# Patient Record
Sex: Female | Born: 1945 | Race: White | Hispanic: No | Marital: Married | Smoking: Former smoker
Health system: Southern US, Community
[De-identification: ages and names within clinical notes are randomized; demographics above are authoritative.]

## PROBLEM LIST (undated history)

## (undated) DIAGNOSIS — E079 Disorder of thyroid, unspecified: Secondary | ICD-10-CM

## (undated) DIAGNOSIS — J189 Pneumonia, unspecified organism: Secondary | ICD-10-CM

## (undated) DIAGNOSIS — I10 Essential (primary) hypertension: Secondary | ICD-10-CM

## (undated) DIAGNOSIS — E785 Hyperlipidemia, unspecified: Secondary | ICD-10-CM

## (undated) HISTORY — DX: Disorder of thyroid, unspecified: E07.9

## (undated) HISTORY — PX: THYROIDECTOMY: SHX17

## (undated) HISTORY — DX: Hyperlipidemia, unspecified: E78.5

## (undated) HISTORY — PX: TUBAL LIGATION: SHX77

## (undated) HISTORY — PX: TONSILLECTOMY AND ADENOIDECTOMY: SUR1326

## (undated) HISTORY — PX: LAPAROSCOPIC ABDOMINAL EXPLORATION: SHX6249

## (undated) HISTORY — DX: Essential (primary) hypertension: I10

---

## 2008-10-30 ENCOUNTER — Encounter: Admission: RE | Admit: 2008-10-30 | Discharge: 2008-10-30 | Payer: Self-pay | Admitting: Family Medicine

## 2008-11-04 ENCOUNTER — Encounter: Admission: RE | Admit: 2008-11-04 | Discharge: 2008-11-04 | Payer: Self-pay | Admitting: Family Medicine

## 2009-03-20 ENCOUNTER — Other Ambulatory Visit: Admission: RE | Admit: 2009-03-20 | Discharge: 2009-03-20 | Payer: Self-pay | Admitting: Family Medicine

## 2009-11-21 ENCOUNTER — Encounter: Admission: RE | Admit: 2009-11-21 | Discharge: 2009-11-21 | Payer: Self-pay | Admitting: Family Medicine

## 2010-12-08 ENCOUNTER — Other Ambulatory Visit: Payer: Self-pay | Admitting: Family Medicine

## 2010-12-08 DIAGNOSIS — Z1231 Encounter for screening mammogram for malignant neoplasm of breast: Secondary | ICD-10-CM

## 2010-12-28 ENCOUNTER — Ambulatory Visit
Admission: RE | Admit: 2010-12-28 | Discharge: 2010-12-28 | Disposition: A | Discharge status: Home / Self Care | Payer: Medicare Other | Source: Ambulatory Visit | Attending: Family Medicine | Admitting: Family Medicine

## 2010-12-28 DIAGNOSIS — Z1231 Encounter for screening mammogram for malignant neoplasm of breast: Secondary | ICD-10-CM

## 2011-04-13 ENCOUNTER — Other Ambulatory Visit (HOSPITAL_COMMUNITY)
Admission: RE | Admit: 2011-04-13 | Discharge: 2011-04-13 | Disposition: A | Discharge status: Home / Self Care | Payer: Medicare Other | Source: Ambulatory Visit | Attending: Family Medicine | Admitting: Family Medicine

## 2011-04-13 ENCOUNTER — Other Ambulatory Visit: Payer: Self-pay | Admitting: Family Medicine

## 2011-04-13 DIAGNOSIS — Z124 Encounter for screening for malignant neoplasm of cervix: Secondary | ICD-10-CM | POA: Insufficient documentation

## 2011-11-29 ENCOUNTER — Other Ambulatory Visit: Payer: Self-pay | Admitting: Family Medicine

## 2011-11-29 DIAGNOSIS — Z1231 Encounter for screening mammogram for malignant neoplasm of breast: Secondary | ICD-10-CM

## 2011-12-29 ENCOUNTER — Ambulatory Visit
Admission: RE | Admit: 2011-12-29 | Discharge: 2011-12-29 | Disposition: A | Discharge status: Home / Self Care | Payer: Medicare Other | Source: Ambulatory Visit | Attending: Family Medicine | Admitting: Family Medicine

## 2011-12-29 DIAGNOSIS — Z1231 Encounter for screening mammogram for malignant neoplasm of breast: Secondary | ICD-10-CM

## 2013-02-26 ENCOUNTER — Other Ambulatory Visit: Payer: Self-pay

## 2013-02-26 DIAGNOSIS — Z1231 Encounter for screening mammogram for malignant neoplasm of breast: Secondary | ICD-10-CM

## 2013-03-21 ENCOUNTER — Ambulatory Visit
Admission: RE | Admit: 2013-03-21 | Discharge: 2013-03-21 | Disposition: A | Discharge status: Home / Self Care | Payer: Medicare Other | Source: Ambulatory Visit

## 2013-03-21 DIAGNOSIS — Z1231 Encounter for screening mammogram for malignant neoplasm of breast: Secondary | ICD-10-CM

## 2013-04-27 ENCOUNTER — Other Ambulatory Visit: Payer: Self-pay | Admitting: Family Medicine

## 2013-04-27 ENCOUNTER — Other Ambulatory Visit (HOSPITAL_COMMUNITY)
Admission: RE | Admit: 2013-04-27 | Discharge: 2013-04-27 | Disposition: A | Discharge status: Home / Self Care | Payer: Medicare Other | Source: Ambulatory Visit | Attending: Family Medicine | Admitting: Family Medicine

## 2013-04-27 ENCOUNTER — Ambulatory Visit
Admission: RE | Admit: 2013-04-27 | Discharge: 2013-04-27 | Disposition: A | Discharge status: Home / Self Care | Payer: Medicare Other | Source: Ambulatory Visit | Attending: Family Medicine | Admitting: Family Medicine

## 2013-04-27 DIAGNOSIS — S93409A Sprain of unspecified ligament of unspecified ankle, initial encounter: Secondary | ICD-10-CM

## 2013-04-27 DIAGNOSIS — Z124 Encounter for screening for malignant neoplasm of cervix: Secondary | ICD-10-CM | POA: Insufficient documentation

## 2014-05-26 ENCOUNTER — Encounter: Payer: Self-pay | Admitting: *Deleted

## 2014-06-25 ENCOUNTER — Other Ambulatory Visit: Payer: Self-pay

## 2014-06-25 DIAGNOSIS — Z1231 Encounter for screening mammogram for malignant neoplasm of breast: Secondary | ICD-10-CM

## 2014-07-15 ENCOUNTER — Encounter (INDEPENDENT_AMBULATORY_CARE_PROVIDER_SITE_OTHER): Payer: Self-pay

## 2014-07-15 ENCOUNTER — Ambulatory Visit
Admission: RE | Admit: 2014-07-15 | Discharge: 2014-07-15 | Disposition: A | Discharge status: Home / Self Care | Payer: Medicare Other | Source: Ambulatory Visit

## 2014-07-15 DIAGNOSIS — Z1231 Encounter for screening mammogram for malignant neoplasm of breast: Secondary | ICD-10-CM

## 2015-08-13 ENCOUNTER — Other Ambulatory Visit: Payer: Self-pay

## 2015-08-13 DIAGNOSIS — Z1231 Encounter for screening mammogram for malignant neoplasm of breast: Secondary | ICD-10-CM

## 2015-10-01 ENCOUNTER — Ambulatory Visit
Admission: RE | Admit: 2015-10-01 | Discharge: 2015-10-01 | Disposition: A | Discharge status: Home / Self Care | Payer: Medicare Other | Source: Ambulatory Visit

## 2015-10-01 DIAGNOSIS — Z1231 Encounter for screening mammogram for malignant neoplasm of breast: Secondary | ICD-10-CM

## 2016-08-19 ENCOUNTER — Encounter (HOSPITAL_COMMUNITY): Payer: Self-pay | Admitting: Emergency Medicine

## 2016-08-19 ENCOUNTER — Ambulatory Visit (INDEPENDENT_AMBULATORY_CARE_PROVIDER_SITE_OTHER): Payer: Medicare Other

## 2016-08-19 ENCOUNTER — Ambulatory Visit (HOSPITAL_COMMUNITY)
Admission: EM | Admit: 2016-08-19 | Discharge: 2016-08-19 | Disposition: A | Discharge status: Home / Self Care | Payer: Medicare Other | Attending: Family Medicine | Admitting: Family Medicine

## 2016-08-19 DIAGNOSIS — S82892A Other fracture of left lower leg, initial encounter for closed fracture: Secondary | ICD-10-CM

## 2016-08-19 DIAGNOSIS — S82891A Other fracture of right lower leg, initial encounter for closed fracture: Secondary | ICD-10-CM

## 2016-08-19 MED ORDER — HYDROCODONE-ACETAMINOPHEN 5-325 MG PO TABS: 1.0000 | ORAL_TABLET | Freq: Four times a day (QID) | ORAL | 0 refills | Status: DC | PRN

## 2016-08-19 NOTE — Discharge Instructions (Signed)
Wear the splints when you are weightbearing until you have been released by the orthopedist.

## 2016-08-19 NOTE — ED Provider Notes (Signed)
[facility]    CSN: [id number] Arrival date & time: 08/19/16  1420     History   Chief Complaint Chief Complaint  Patient presents with  . Ankle Injury    HPI Tonya Adams is a 71 y.o. female.   This is a 71 year old woman who fell at noon today in her garage when she was stepping off the lowest step. She presents to the [facility] urgent care center with bilateral ankle pain and swelling, made worse by movement. She has been able to put partial weight on each of her ankles, the right ankle being the worse of the 2.      Past Medical History:  Diagnosis Date  . Hyperlipidemia   . Hypertension   . Thyroid disease     There are no active problems to display for this patient.   Past Surgical History:  Procedure Laterality Date  . LAPAROSCOPIC ABDOMINAL EXPLORATION    . THYROIDECTOMY    . TONSILLECTOMY AND ADENOIDECTOMY    . TUBAL LIGATION      OB History    No data available       Home Medications    Prior to Admission medications   Medication Sig Start Date End Date Taking? Authorizing Provider  levothyroxine (SYNTHROID, LEVOTHROID) 112 MCG tablet Take 112 mcg by mouth daily before breakfast.   Yes Historical Provider, MD  lisinopril (PRINIVIL,ZESTRIL) 20 MG tablet Take 20 mg by mouth daily.   Yes Historical Provider, MD  simvastatin (ZOCOR) 40 MG tablet Take 40 mg by mouth daily.   Yes Historical Provider, MD  HYDROcodone-acetaminophen (NORCO) 5-325 MG tablet Take 1 tablet by mouth every 6 (six) hours as needed for moderate pain. 08/19/16   Elvina Sidle, MD  Multiple Vitamin (MULTIVITAMIN) capsule Take 1 capsule by mouth daily.    Historical Provider, MD    Family History Family History  Problem Relation Age of Onset  . Heart attack Mother   . CAD Father     Social History Social History  Substance Use Topics  . Smoking status: Former Games developer  . Smokeless tobacco: Never Used  . Alcohol use Yes     Allergies     Sulfa antibiotics   Review of Systems Review of Systems  Constitutional: Negative.   Musculoskeletal: Positive for arthralgias, gait problem and joint swelling.     Physical Exam Triage Vital Signs ED Triage Vitals  Enc Vitals Group     BP 08/19/16 1530 146/70     Pulse Rate 08/19/16 1530 89     Resp 08/19/16 1530 20     Temp 08/19/16 1530 99 F (37.2 C)     Temp Source 08/19/16 1530 Oral     SpO2 08/19/16 1530 97 %     Weight --      Height --      Head Circumference --      Peak Flow --      Pain Score 08/19/16 1533 2     Pain Loc --      Pain Edu? --      Excl. in GC? --    No data found.   Updated Vital Signs BP 146/70 (BP Location: Left Arm)   Pulse 89   Temp 99 F (37.2 C) (Oral)   Resp 20   SpO2 97%    Physical Exam  Constitutional: She is oriented to person, place, and time. She appears well-developed and well-nourished.  HENT:  Head:  Normocephalic.  Right Ear: External ear normal.  Left Ear: External ear normal.  Mouth/Throat: Oropharynx is clear and moist.  Eyes: Conjunctivae are normal.  Neck: Normal range of motion. Neck supple.  Pulmonary/Chest: Effort normal.  Musculoskeletal: She exhibits no deformity.  No lateral malleolar tenderness although there is some ecchymosis and swelling bilaterally. Patient has a good dorsalis pedal pulse on each side and she has no pain in the fifth metatarsal on either side.  Neurological: She is alert and oriented to person, place, and time.  Skin: Skin is warm and dry.  Nursing note and vitals reviewed.    UC Treatments / Results  Labs (all labs ordered are listed, but only abnormal results are displayed) Labs Reviewed - No data to display  EKG  EKG Interpretation None       Radiology Dg Ankle Complete Left  Result Date: 08/19/2016 CLINICAL DATA:  A fall down stairs today with a left ankle injury. Pain. Initial encounter. EXAM: LEFT ANKLE COMPLETE - 3+ VIEW COMPARISON:  None. FINDINGS: Tiny bone  fragment off the dorsal margin of the neck of the talus is likely due to small avulsion fracture. Imaged bones otherwise appear normal. No dislocation. IMPRESSION: Tiny avulsion fracture dorsal neck of the distal talus. Electronically Signed   By: Drusilla Kannerhomas  Dalessio M.D.   On: 08/19/2016 16:08   Dg Ankle Complete Right  Result Date: 08/19/2016 CLINICAL DATA:  Ankle pain status post fall.  Unable to bear weight. EXAM: RIGHT ANKLE - COMPLETE 3+ VIEW COMPARISON:  04/27/2013 FINDINGS: There is a small right ankle joint effusion. Mild soft tissue swelling is seen along the lateral malleolus. No evidence of displaced fracture. There is however a linear oblique lucency through the distal fibula which may represent a nondisplaced fracture. IMPRESSION: Possible nondisplaced oblique fracture of the distal fibula. Right ankle joint effusion. Electronically Signed   By: Ted Mcalpineobrinka  Dimitrova M.D.   On: 08/19/2016 16:13    Procedures Procedures (including critical care time)  Medications Ordered in UC Medications - No data to display   Initial Impression / Assessment and Plan / UC Course  I have reviewed the triage vital signs and the nursing notes.  Pertinent labs & imaging results that were available during my care of the patient were reviewed by me and considered in my medical decision making (see chart for details).     Final Clinical Impressions(s) / UC Diagnoses   Final diagnoses:  Closed fracture of right ankle, initial encounter  Closed fracture of left ankle, initial encounter    New Prescriptions New Prescriptions   HYDROCODONE-ACETAMINOPHEN (NORCO) 5-325 MG TABLET    Take 1 tablet by mouth every 6 (six) hours as needed for moderate pain.  Cam Walker to the right ankle and ASO splint to the left ankle   Elvina SidleKurt Elex Mainwaring, MD 08/19/16 1624

## 2016-08-19 NOTE — ED Triage Notes (Signed)
Here for bilateral ankle inj onset 1200  Reports she fell down 1 step in garage and landed on concrete flooring  Sx include: swelling and pain... increases w/movement.   Brought back on wheelchair.   A&O x4... NAD

## 2016-08-24 ENCOUNTER — Ambulatory Visit (INDEPENDENT_AMBULATORY_CARE_PROVIDER_SITE_OTHER): Payer: Medicare Other | Admitting: Orthopaedic Surgery

## 2016-08-24 DIAGNOSIS — S93492A Sprain of other ligament of left ankle, initial encounter: Secondary | ICD-10-CM | POA: Diagnosis not present

## 2016-08-24 DIAGNOSIS — S93402A Sprain of unspecified ligament of left ankle, initial encounter: Secondary | ICD-10-CM | POA: Insufficient documentation

## 2016-08-24 DIAGNOSIS — S8264XA Nondisplaced fracture of lateral malleolus of right fibula, initial encounter for closed fracture: Secondary | ICD-10-CM

## 2016-08-24 NOTE — Progress Notes (Signed)
Office Visit Note   Patient: Tonya Adams           Date of Birth: [birth year]           MRN: [id number] Visit Date: 08/24/2016              Requested by: Mila PalmerSharon Wolters, MD [address] [place], [place] [address] PCP: Emeterio ReeveWOLTERS,Marieelena A, MD   Assessment & Plan: Visit Diagnoses:  1. Sprain of other ligament of left ankle, initial encounter   2. Nondisplaced fracture of lateral malleolus of right fibula, initial encounter for closed fracture     Plan: ASO brace for her left ankle for 2 weeks. Cam Walker for the right ankle fo 2 weeks. Follow-up in 2 weeks for repeat 3 view x-rays of the right ankle. We'll likely discontinue Cam Walker and left ASO to the right ankle  Follow-Up Instructions: Return in about 2 weeks (around 09/07/2016).   Orders:  No orders of the defined types were placed in this encounter.  No orders of the defined types were placed in this encounter.     Procedures: No procedures performed   Clinical Data: No additional findings.   Subjective: No chief complaint on file.   Patient is 71 year old female with bilateral ankle injuries from a mechanical fall on 08/19/2016. She has a nondisplaced distal fibula fracture and avulsion fracture of the talus. Her pain has improved significantly with ice and elevation wrist Aleve. She is currently wearing a Cam Walker. The pain does not radiate.    Review of Systems Complete review of systems negative except for history of present illness  Objective: Vital Signs: There were no vitals taken for this visit.  Physical Exam  Constitutional: She is oriented to person, place, and time. She appears well-developed and well-nourished.  HENT:  Head: Normocephalic and atraumatic.  Eyes: EOM are normal.  Neck: Neck supple.  Pulmonary/Chest: Effort normal.  Abdominal: Soft.  Neurological: She is alert and oriented to person, place, and time.  Skin: Skin is warm. Capillary refill takes less than 2  seconds.  Psychiatric: She has a normal mood and affect. Her behavior is normal. Judgment and thought content normal.  Nursing note and vitals reviewed.   Ortho Exam Exam of the right ankle shows mild mild swelling with tenderness over the distal fibula. Otherwise exam is benign. Exam of the left ankle shows minimal swelling. She has good range of motion. Mild tenderness over the dorsum of the talar neck. Specialty Comments:  No specialty comments available.  Imaging: No results found.   PMFS History: Patient Active Problem List   Diagnosis Date Noted  . Left ankle sprain 08/24/2016  . Nondisplaced fracture of lateral malleolus of right fibula, initial encounter for closed fracture 08/24/2016   Past Medical History:  Diagnosis Date  . Hyperlipidemia   . Hypertension   . Thyroid disease     Family History  Problem Relation Age of Onset  . Heart attack Mother   . CAD Father     Past Surgical History:  Procedure Laterality Date  . LAPAROSCOPIC ABDOMINAL EXPLORATION    . THYROIDECTOMY    . TONSILLECTOMY AND ADENOIDECTOMY    . TUBAL LIGATION     Social History   Occupational History  . Not on file.   Social History Main Topics  . Smoking status: Former Games developermoker  . Smokeless tobacco: Never Used  . Alcohol use Yes  . Drug use: No  . Sexual activity: Not  on file

## 2016-09-07 ENCOUNTER — Encounter (INDEPENDENT_AMBULATORY_CARE_PROVIDER_SITE_OTHER): Payer: Self-pay | Admitting: Orthopaedic Surgery

## 2016-09-07 ENCOUNTER — Ambulatory Visit (INDEPENDENT_AMBULATORY_CARE_PROVIDER_SITE_OTHER): Payer: Medicare Other | Admitting: Orthopaedic Surgery

## 2016-09-07 ENCOUNTER — Ambulatory Visit (INDEPENDENT_AMBULATORY_CARE_PROVIDER_SITE_OTHER): Payer: Medicare Other

## 2016-09-07 DIAGNOSIS — S8264XA Nondisplaced fracture of lateral malleolus of right fibula, initial encounter for closed fracture: Secondary | ICD-10-CM

## 2016-09-07 NOTE — Progress Notes (Signed)
Jasmine DecemberSharon comes back for follow up of right fibula fracture and left ankle sprain.  Doing well, still some soreness.  Left ankle is mildly ttp.  Right ankle xrays show stable fibula fracture.  D/c aso on the left.  ASO for the right ankle.  F/u 3 weeks for repeat right ankle xrays.

## 2016-09-15 ENCOUNTER — Other Ambulatory Visit: Payer: Self-pay | Admitting: Family Medicine

## 2016-09-15 DIAGNOSIS — Z1231 Encounter for screening mammogram for malignant neoplasm of breast: Secondary | ICD-10-CM

## 2016-09-30 ENCOUNTER — Ambulatory Visit (INDEPENDENT_AMBULATORY_CARE_PROVIDER_SITE_OTHER): Payer: Medicare Other

## 2016-09-30 ENCOUNTER — Encounter (INDEPENDENT_AMBULATORY_CARE_PROVIDER_SITE_OTHER): Payer: Self-pay | Admitting: Orthopaedic Surgery

## 2016-09-30 ENCOUNTER — Ambulatory Visit (INDEPENDENT_AMBULATORY_CARE_PROVIDER_SITE_OTHER): Payer: Medicare Other | Admitting: Orthopaedic Surgery

## 2016-09-30 DIAGNOSIS — S8264XD Nondisplaced fracture of lateral malleolus of right fibula, subsequent encounter for closed fracture with routine healing: Secondary | ICD-10-CM | POA: Diagnosis not present

## 2016-09-30 NOTE — Progress Notes (Signed)
Patient comes back today for follow-up of her ankle fracture. She is overall doing well. Her left ankle is nonsymptomatic. Her right ankle still is a little tender. She is wearing the ASO brace. X-ray show healed fibula fracture without complications. At this point may begin to wean ASO brace as tolerated as well as increase activity as tolerated. Follow-up with me as needed.

## 2016-10-07 ENCOUNTER — Ambulatory Visit
Admission: RE | Admit: 2016-10-07 | Discharge: 2016-10-07 | Disposition: A | Discharge status: Home / Self Care | Payer: Medicare Other | Source: Ambulatory Visit | Attending: Family Medicine | Admitting: Family Medicine

## 2016-10-07 DIAGNOSIS — Z1231 Encounter for screening mammogram for malignant neoplasm of breast: Secondary | ICD-10-CM

## 2017-08-30 ENCOUNTER — Other Ambulatory Visit: Payer: Self-pay | Admitting: Family Medicine

## 2017-08-30 DIAGNOSIS — Z1231 Encounter for screening mammogram for malignant neoplasm of breast: Secondary | ICD-10-CM

## 2017-10-10 ENCOUNTER — Ambulatory Visit
Admission: RE | Admit: 2017-10-10 | Discharge: 2017-10-10 | Disposition: A | Discharge status: Home / Self Care | Payer: Medicare Other | Source: Ambulatory Visit | Attending: Family Medicine | Admitting: Family Medicine

## 2017-10-10 DIAGNOSIS — Z1231 Encounter for screening mammogram for malignant neoplasm of breast: Secondary | ICD-10-CM

## 2018-09-26 ENCOUNTER — Other Ambulatory Visit: Payer: Self-pay | Admitting: Family Medicine

## 2018-09-26 DIAGNOSIS — Z1231 Encounter for screening mammogram for malignant neoplasm of breast: Secondary | ICD-10-CM

## 2018-09-26 DIAGNOSIS — E2839 Other primary ovarian failure: Secondary | ICD-10-CM

## 2018-10-20 ENCOUNTER — Ambulatory Visit: Payer: Medicare Other

## 2018-11-27 ENCOUNTER — Other Ambulatory Visit: Payer: Medicare Other

## 2018-12-25 ENCOUNTER — Ambulatory Visit
Admission: RE | Admit: 2018-12-25 | Discharge: 2018-12-25 | Disposition: A | Discharge status: Home / Self Care | Payer: Medicare Other | Source: Ambulatory Visit | Attending: Family Medicine | Admitting: Family Medicine

## 2018-12-25 ENCOUNTER — Other Ambulatory Visit: Payer: Self-pay

## 2018-12-25 DIAGNOSIS — Z1231 Encounter for screening mammogram for malignant neoplasm of breast: Secondary | ICD-10-CM

## 2018-12-25 DIAGNOSIS — E2839 Other primary ovarian failure: Secondary | ICD-10-CM

## 2019-07-31 ENCOUNTER — Ambulatory Visit: Payer: Medicare Other | Attending: Internal Medicine

## 2019-07-31 DIAGNOSIS — Z23 Encounter for immunization: Secondary | ICD-10-CM | POA: Insufficient documentation

## 2019-07-31 NOTE — Progress Notes (Signed)
   Covid-19 Vaccination Clinic  Name:  Tonya Adams    MRN: [id number] DOB: 03-26-1946  07/31/2019  [name] was observed post Covid-19 immunization for 15 minutes without incidence. She was provided with Vaccine Information Sheet and instruction to access the V-Safe system.   [name] was instructed to call 911 with any severe reactions post vaccine: Marland Kitchen Difficulty breathing  . Swelling of your face and throat  . A fast heartbeat  . A bad rash all over your body  . Dizziness and weakness    Immunizations Administered    Name Date Dose VIS Date Route   Pfizer COVID-19 Vaccine 07/31/2019 11:29 AM 0.3 mL 06/29/2019 Intramuscular   Manufacturer: [employer], [employer]   Lot: [id number]   NDC: [address]-4

## 2019-08-20 ENCOUNTER — Ambulatory Visit: Payer: Medicare Other | Attending: Internal Medicine

## 2019-08-20 DIAGNOSIS — Z23 Encounter for immunization: Secondary | ICD-10-CM | POA: Insufficient documentation

## 2019-08-20 NOTE — Progress Notes (Signed)
   Covid-19 Vaccination Clinic  Name:  Tonya Adams    MRN: [id number] DOB: 10-18-1945  08/20/2019  Tonya Adams was observed post Covid-19 immunization for 15 minutes without incidence. She was provided with Vaccine Information Sheet and instruction to access the V-Safe system.   Tonya Adams was instructed to call 911 with any severe reactions post vaccine: Marland Kitchen Difficulty breathing  . Swelling of your face and throat  . A fast heartbeat  . A bad rash all over your body  . Dizziness and weakness    Immunizations Administered    Name Date Dose VIS Date Route   Pfizer COVID-19 Vaccine 08/20/2019 11:12 AM 0.3 mL 06/29/2019 Intramuscular   Manufacturer: [employer], [employer]   Lot: [id number]   NDC: [address]-9

## 2019-11-16 ENCOUNTER — Other Ambulatory Visit: Payer: Self-pay | Admitting: Family Medicine

## 2019-11-16 DIAGNOSIS — Z1231 Encounter for screening mammogram for malignant neoplasm of breast: Secondary | ICD-10-CM

## 2019-12-26 ENCOUNTER — Other Ambulatory Visit: Payer: Self-pay

## 2019-12-26 ENCOUNTER — Ambulatory Visit
Admission: RE | Admit: 2019-12-26 | Discharge: 2019-12-26 | Disposition: A | Discharge status: Home / Self Care | Payer: Medicare Other | Source: Ambulatory Visit | Attending: Family Medicine | Admitting: Family Medicine

## 2019-12-26 DIAGNOSIS — Z1231 Encounter for screening mammogram for malignant neoplasm of breast: Secondary | ICD-10-CM

## 2020-11-20 DIAGNOSIS — M8580 Other specified disorders of bone density and structure, unspecified site: Secondary | ICD-10-CM | POA: Diagnosis not present

## 2020-11-20 DIAGNOSIS — Z79899 Other long term (current) drug therapy: Secondary | ICD-10-CM | POA: Diagnosis not present

## 2020-11-20 DIAGNOSIS — J309 Allergic rhinitis, unspecified: Secondary | ICD-10-CM | POA: Diagnosis not present

## 2020-11-20 DIAGNOSIS — E039 Hypothyroidism, unspecified: Secondary | ICD-10-CM | POA: Diagnosis not present

## 2020-11-20 DIAGNOSIS — I10 Essential (primary) hypertension: Secondary | ICD-10-CM | POA: Diagnosis not present

## 2020-11-20 DIAGNOSIS — E785 Hyperlipidemia, unspecified: Secondary | ICD-10-CM | POA: Diagnosis not present

## 2020-11-20 DIAGNOSIS — D692 Other nonthrombocytopenic purpura: Secondary | ICD-10-CM | POA: Diagnosis not present

## 2020-11-20 DIAGNOSIS — Z0000 Encounter for general adult medical examination without abnormal findings: Secondary | ICD-10-CM | POA: Diagnosis not present

## 2020-11-21 ENCOUNTER — Other Ambulatory Visit: Payer: Self-pay | Admitting: Family Medicine

## 2020-11-21 DIAGNOSIS — Z1231 Encounter for screening mammogram for malignant neoplasm of breast: Secondary | ICD-10-CM

## 2020-11-24 ENCOUNTER — Other Ambulatory Visit: Payer: Self-pay | Admitting: Family Medicine

## 2020-11-24 DIAGNOSIS — E2839 Other primary ovarian failure: Secondary | ICD-10-CM

## 2020-12-31 DIAGNOSIS — Z1211 Encounter for screening for malignant neoplasm of colon: Secondary | ICD-10-CM | POA: Diagnosis not present

## 2021-01-07 DIAGNOSIS — H04123 Dry eye syndrome of bilateral lacrimal glands: Secondary | ICD-10-CM | POA: Diagnosis not present

## 2021-01-07 DIAGNOSIS — H2513 Age-related nuclear cataract, bilateral: Secondary | ICD-10-CM | POA: Diagnosis not present

## 2021-01-14 ENCOUNTER — Other Ambulatory Visit: Payer: Self-pay

## 2021-01-14 ENCOUNTER — Ambulatory Visit
Admission: RE | Admit: 2021-01-14 | Discharge: 2021-01-14 | Disposition: A | Discharge status: Home / Self Care | Payer: Medicare Other | Source: Ambulatory Visit | Attending: Family Medicine | Admitting: Family Medicine

## 2021-01-14 DIAGNOSIS — Z1231 Encounter for screening mammogram for malignant neoplasm of breast: Secondary | ICD-10-CM

## 2021-02-06 DIAGNOSIS — M25561 Pain in right knee: Secondary | ICD-10-CM | POA: Diagnosis not present

## 2021-02-17 DIAGNOSIS — M1711 Unilateral primary osteoarthritis, right knee: Secondary | ICD-10-CM | POA: Diagnosis not present

## 2021-05-20 ENCOUNTER — Other Ambulatory Visit: Payer: Self-pay

## 2021-05-20 ENCOUNTER — Ambulatory Visit
Admission: RE | Admit: 2021-05-20 | Discharge: 2021-05-20 | Disposition: A | Discharge status: Home / Self Care | Payer: Medicare Other | Source: Ambulatory Visit | Attending: Family Medicine | Admitting: Family Medicine

## 2021-05-20 DIAGNOSIS — M8589 Other specified disorders of bone density and structure, multiple sites: Secondary | ICD-10-CM | POA: Diagnosis not present

## 2021-05-20 DIAGNOSIS — E2839 Other primary ovarian failure: Secondary | ICD-10-CM

## 2021-05-21 DIAGNOSIS — M722 Plantar fascial fibromatosis: Secondary | ICD-10-CM | POA: Diagnosis not present

## 2021-05-21 DIAGNOSIS — R1031 Right lower quadrant pain: Secondary | ICD-10-CM | POA: Diagnosis not present

## 2021-05-21 DIAGNOSIS — I10 Essential (primary) hypertension: Secondary | ICD-10-CM | POA: Diagnosis not present

## 2021-11-12 IMAGING — MG MM DIGITAL SCREENING BILAT W/ TOMO AND CAD
8 series · 9 of 24 positions shown · non-contrast
Comparison: Previous exam(s).

CLINICAL DATA: Screening.

EXAM:
DIGITAL SCREENING BILATERAL MAMMOGRAM WITH TOMOSYNTHESIS AND CAD
TECHNIQUE: Bilateral screening digital craniocaudal and mediolateral oblique
mammograms were obtained. Bilateral screening digital breast
tomosynthesis was performed. The images were evaluated with
computer-aided detection.

[L CC synth-2D]
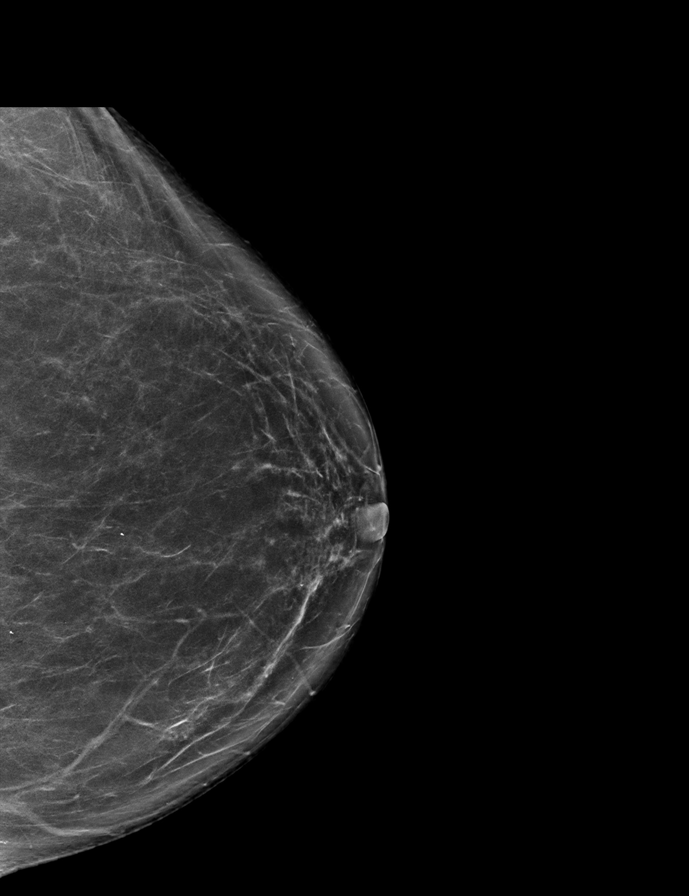

[R MLO synth-2D]
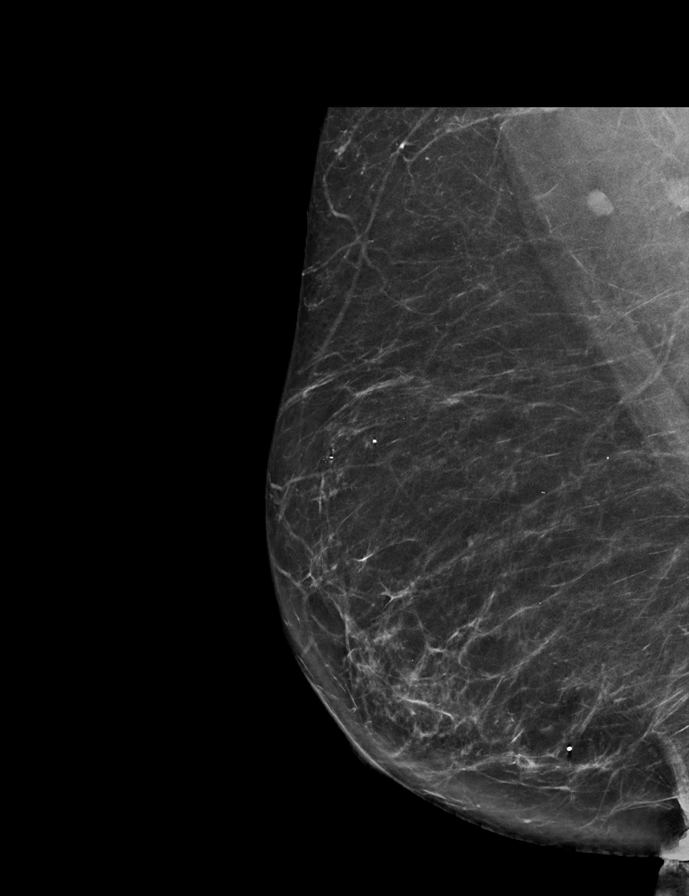

[L MLO synth-2D]
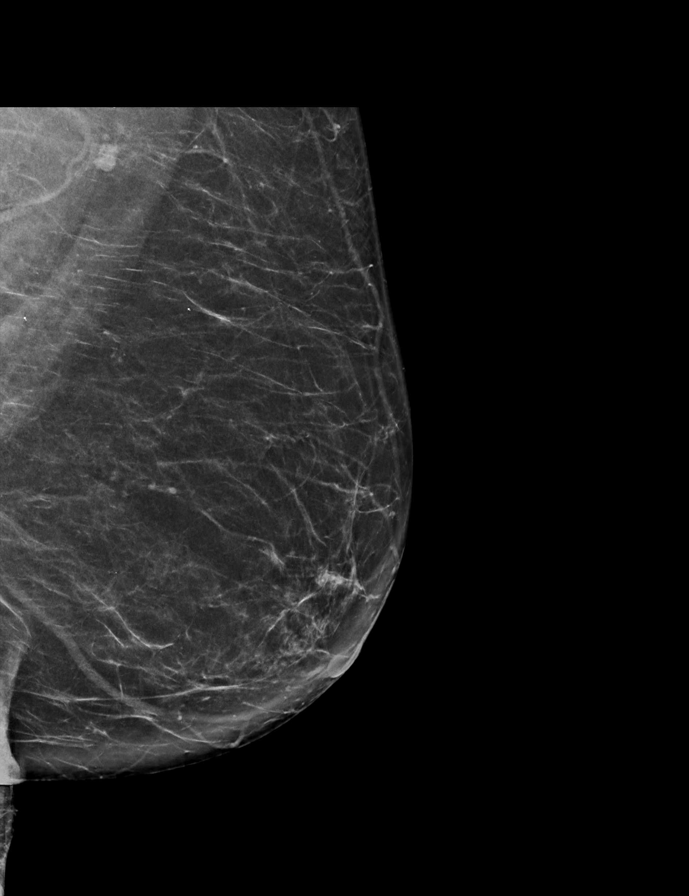

[R CC synth-2D]
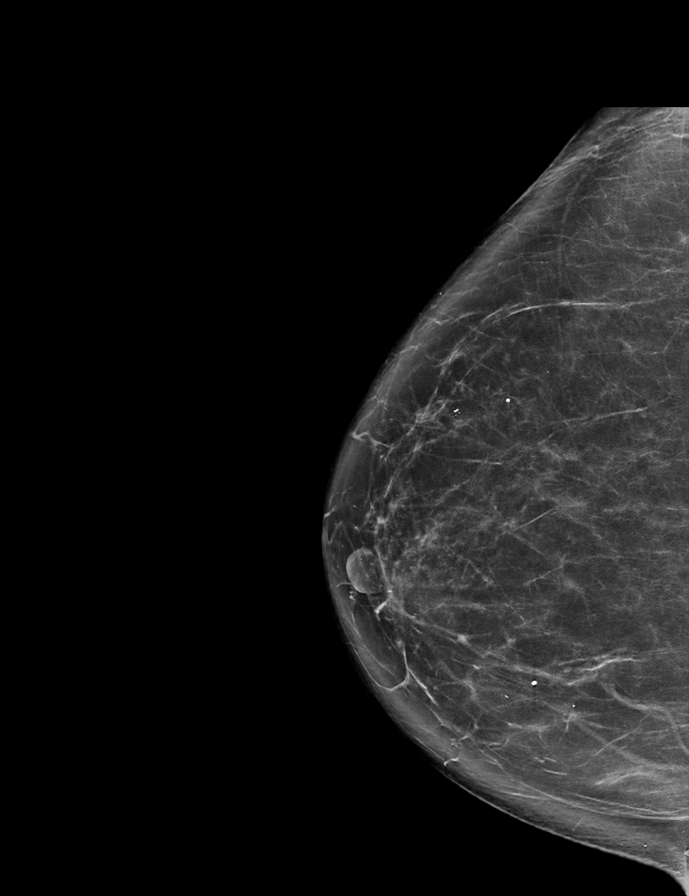

[R MLO tomo · 2 of 77 frames shown]
[frame 25/77]
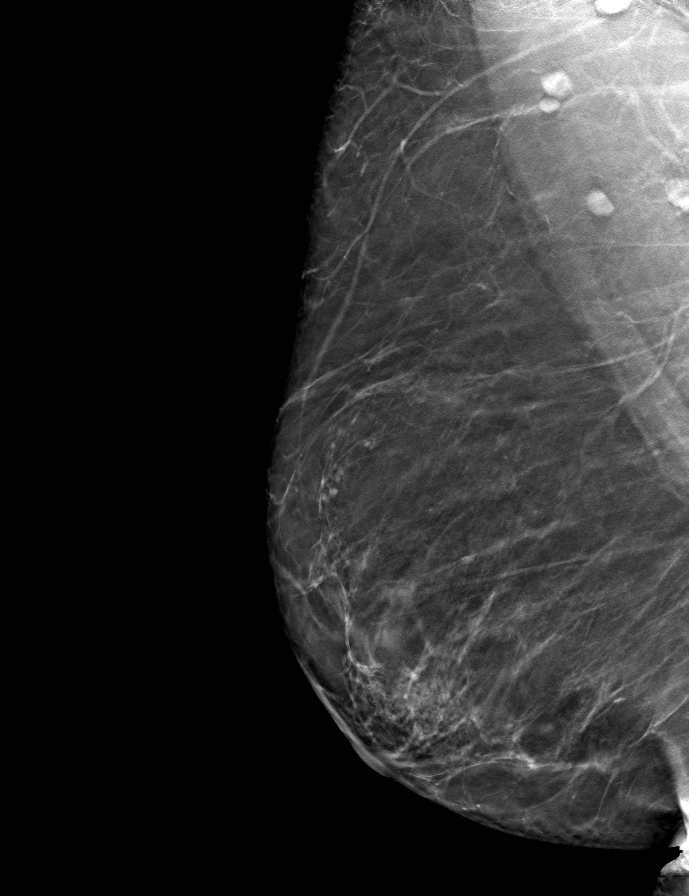
[frame 39/77]
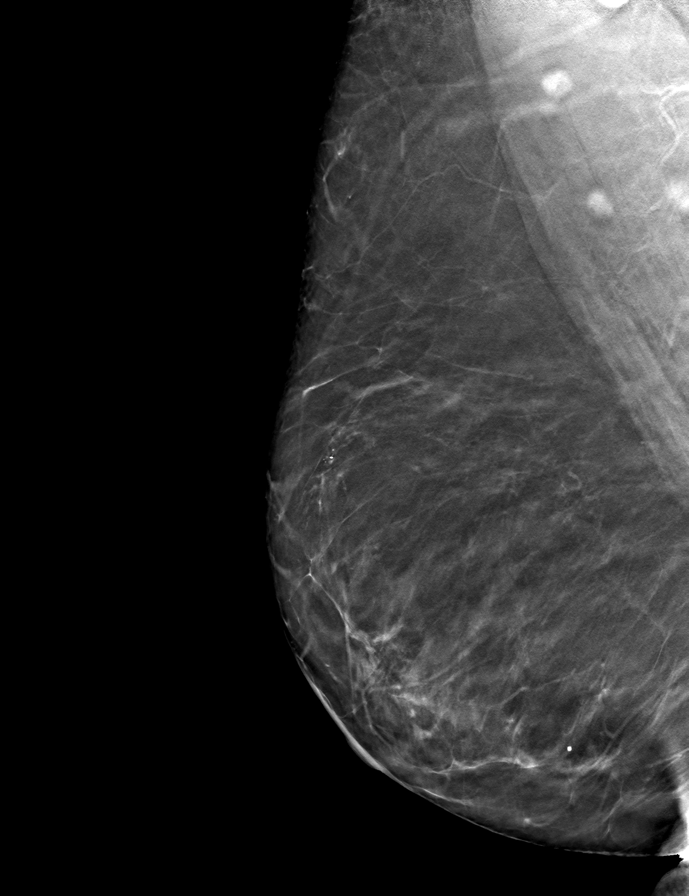

[L MLO tomo · tomo slice 39/78.0]
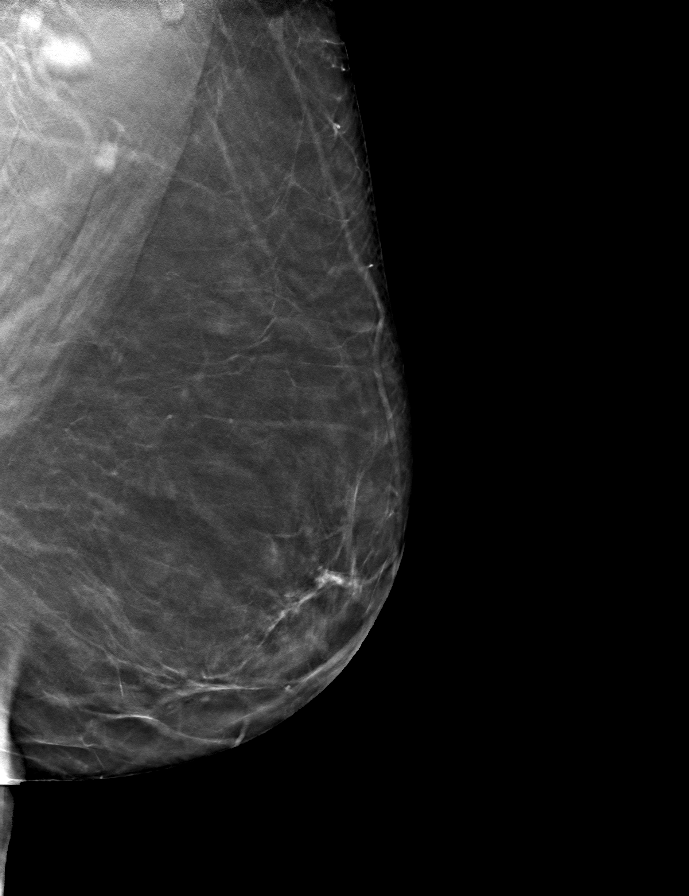

[L CC tomo · tomo slice 39/78.0]
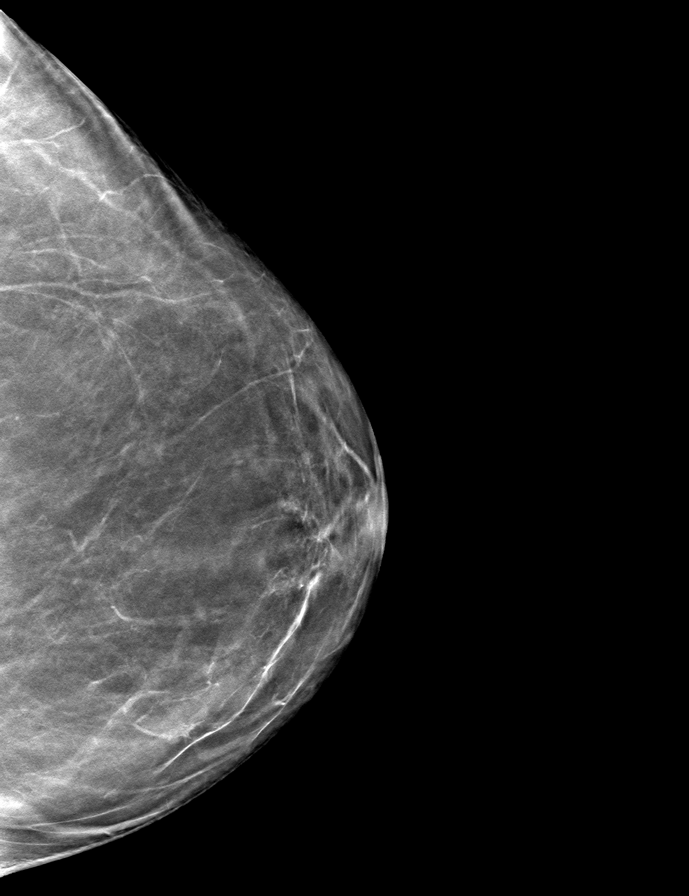

[R CC tomo · tomo slice 41/80.0]
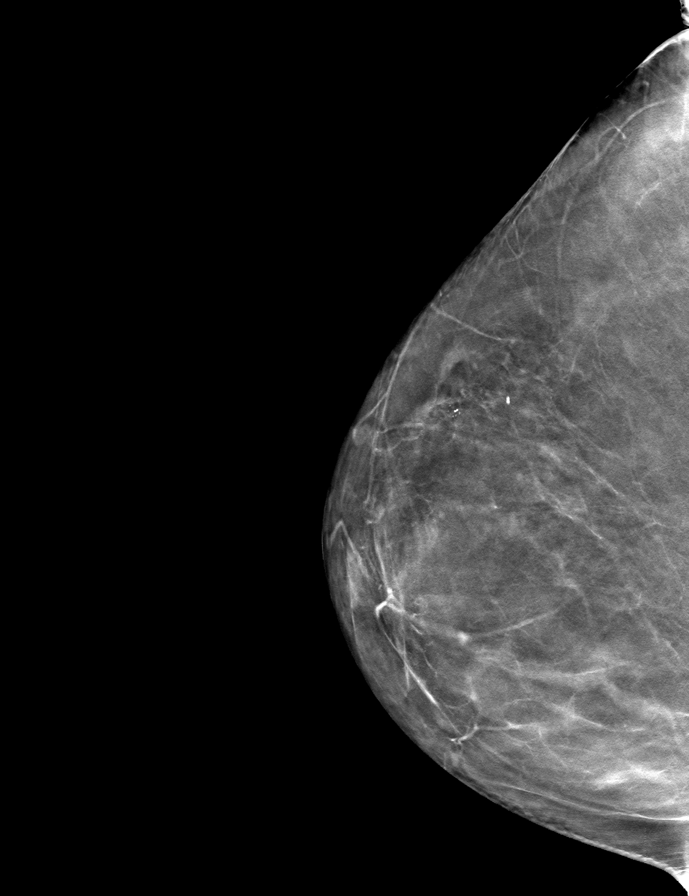

[9 of 24 positions shown; findings below may reference images not displayed]

ACR Breast Density Category b: There are scattered areas of
fibroglandular density.
FINDINGS: There are no findings suspicious for malignancy.
IMPRESSION: No mammographic evidence of malignancy. A result letter of this
screening mammogram will be mailed directly to the patient.

RECOMMENDATION:
Screening mammogram in one year. (Code:51-O-LD2)

BI-RADS CATEGORY  1: Negative.

## 2021-11-30 DIAGNOSIS — D692 Other nonthrombocytopenic purpura: Secondary | ICD-10-CM | POA: Diagnosis not present

## 2021-11-30 DIAGNOSIS — I10 Essential (primary) hypertension: Secondary | ICD-10-CM | POA: Diagnosis not present

## 2021-11-30 DIAGNOSIS — M8588 Other specified disorders of bone density and structure, other site: Secondary | ICD-10-CM | POA: Diagnosis not present

## 2021-11-30 DIAGNOSIS — Z0000 Encounter for general adult medical examination without abnormal findings: Secondary | ICD-10-CM | POA: Diagnosis not present

## 2021-11-30 DIAGNOSIS — J309 Allergic rhinitis, unspecified: Secondary | ICD-10-CM | POA: Diagnosis not present

## 2021-11-30 DIAGNOSIS — E785 Hyperlipidemia, unspecified: Secondary | ICD-10-CM | POA: Diagnosis not present

## 2021-11-30 DIAGNOSIS — Z79899 Other long term (current) drug therapy: Secondary | ICD-10-CM | POA: Diagnosis not present

## 2021-11-30 DIAGNOSIS — E039 Hypothyroidism, unspecified: Secondary | ICD-10-CM | POA: Diagnosis not present

## 2021-12-15 ENCOUNTER — Other Ambulatory Visit: Payer: Self-pay | Admitting: Family Medicine

## 2021-12-15 DIAGNOSIS — Z1231 Encounter for screening mammogram for malignant neoplasm of breast: Secondary | ICD-10-CM

## 2022-01-11 DIAGNOSIS — H2513 Age-related nuclear cataract, bilateral: Secondary | ICD-10-CM | POA: Diagnosis not present

## 2022-01-11 DIAGNOSIS — H04123 Dry eye syndrome of bilateral lacrimal glands: Secondary | ICD-10-CM | POA: Diagnosis not present

## 2022-01-15 ENCOUNTER — Ambulatory Visit
Admission: RE | Admit: 2022-01-15 | Discharge: 2022-01-15 | Disposition: A | Discharge status: Home / Self Care | Payer: Medicare Other | Source: Ambulatory Visit | Attending: Family Medicine | Admitting: Family Medicine

## 2022-01-15 DIAGNOSIS — Z1231 Encounter for screening mammogram for malignant neoplasm of breast: Secondary | ICD-10-CM | POA: Diagnosis not present

## 2022-02-01 DIAGNOSIS — I10 Essential (primary) hypertension: Secondary | ICD-10-CM | POA: Diagnosis not present

## 2022-02-01 DIAGNOSIS — H00024 Hordeolum internum left upper eyelid: Secondary | ICD-10-CM | POA: Diagnosis not present

## 2022-02-01 DIAGNOSIS — Z0130 Encounter for examination of blood pressure without abnormal findings: Secondary | ICD-10-CM | POA: Diagnosis not present

## 2022-02-17 DIAGNOSIS — H00024 Hordeolum internum left upper eyelid: Secondary | ICD-10-CM | POA: Diagnosis not present

## 2022-08-26 DIAGNOSIS — J90 Pleural effusion, not elsewhere classified: Secondary | ICD-10-CM | POA: Diagnosis not present

## 2022-08-26 DIAGNOSIS — R0781 Pleurodynia: Secondary | ICD-10-CM | POA: Diagnosis not present

## 2022-08-26 DIAGNOSIS — M549 Dorsalgia, unspecified: Secondary | ICD-10-CM | POA: Diagnosis not present

## 2022-08-26 DIAGNOSIS — J9811 Atelectasis: Secondary | ICD-10-CM | POA: Diagnosis not present

## 2022-08-26 DIAGNOSIS — R222 Localized swelling, mass and lump, trunk: Secondary | ICD-10-CM | POA: Diagnosis not present

## 2022-09-27 ENCOUNTER — Other Ambulatory Visit: Payer: Self-pay | Admitting: Family Medicine

## 2022-09-27 DIAGNOSIS — J189 Pneumonia, unspecified organism: Secondary | ICD-10-CM

## 2022-09-28 ENCOUNTER — Encounter (HOSPITAL_BASED_OUTPATIENT_CLINIC_OR_DEPARTMENT_OTHER): Payer: Self-pay

## 2022-09-28 ENCOUNTER — Ambulatory Visit (HOSPITAL_BASED_OUTPATIENT_CLINIC_OR_DEPARTMENT_OTHER)
Admission: RE | Admit: 2022-09-28 | Discharge: 2022-09-28 | Disposition: A | Discharge status: Home / Self Care | Payer: Medicare Other | Source: Ambulatory Visit | Attending: Family Medicine | Admitting: Family Medicine

## 2022-09-28 ENCOUNTER — Other Ambulatory Visit: Payer: Medicare Other

## 2022-09-28 DIAGNOSIS — J90 Pleural effusion, not elsewhere classified: Secondary | ICD-10-CM | POA: Diagnosis not present

## 2022-09-28 DIAGNOSIS — J439 Emphysema, unspecified: Secondary | ICD-10-CM | POA: Diagnosis not present

## 2022-09-28 DIAGNOSIS — J189 Pneumonia, unspecified organism: Secondary | ICD-10-CM

## 2022-09-28 MED ORDER — IOHEXOL 300 MG/ML  SOLN
100.0000 mL | Freq: Once | INTRAMUSCULAR | Status: AC | PRN
  Administered 2022-09-28: 80 mL via INTRAVENOUS

## 2022-09-30 ENCOUNTER — Ambulatory Visit (HOSPITAL_BASED_OUTPATIENT_CLINIC_OR_DEPARTMENT_OTHER): Payer: Medicare Other

## 2022-10-06 DIAGNOSIS — I700 Atherosclerosis of aorta: Secondary | ICD-10-CM | POA: Diagnosis not present

## 2022-10-06 DIAGNOSIS — J439 Emphysema, unspecified: Secondary | ICD-10-CM | POA: Diagnosis not present

## 2022-10-06 DIAGNOSIS — M8588 Other specified disorders of bone density and structure, other site: Secondary | ICD-10-CM | POA: Diagnosis not present

## 2022-10-06 DIAGNOSIS — R091 Pleurisy: Secondary | ICD-10-CM | POA: Diagnosis not present

## 2022-11-15 DIAGNOSIS — J690 Pneumonitis due to inhalation of food and vomit: Secondary | ICD-10-CM | POA: Diagnosis not present

## 2022-12-30 DIAGNOSIS — Z79899 Other long term (current) drug therapy: Secondary | ICD-10-CM | POA: Diagnosis not present

## 2022-12-30 DIAGNOSIS — E785 Hyperlipidemia, unspecified: Secondary | ICD-10-CM | POA: Diagnosis not present

## 2022-12-30 DIAGNOSIS — M8588 Other specified disorders of bone density and structure, other site: Secondary | ICD-10-CM | POA: Diagnosis not present

## 2022-12-30 DIAGNOSIS — E039 Hypothyroidism, unspecified: Secondary | ICD-10-CM | POA: Diagnosis not present

## 2023-01-03 DIAGNOSIS — I10 Essential (primary) hypertension: Secondary | ICD-10-CM | POA: Diagnosis not present

## 2023-01-03 DIAGNOSIS — E039 Hypothyroidism, unspecified: Secondary | ICD-10-CM | POA: Diagnosis not present

## 2023-01-03 DIAGNOSIS — Z0000 Encounter for general adult medical examination without abnormal findings: Secondary | ICD-10-CM | POA: Diagnosis not present

## 2023-01-03 DIAGNOSIS — M8588 Other specified disorders of bone density and structure, other site: Secondary | ICD-10-CM | POA: Diagnosis not present

## 2023-01-03 DIAGNOSIS — E785 Hyperlipidemia, unspecified: Secondary | ICD-10-CM | POA: Diagnosis not present

## 2023-01-03 DIAGNOSIS — Z79899 Other long term (current) drug therapy: Secondary | ICD-10-CM | POA: Diagnosis not present

## 2023-01-17 DIAGNOSIS — H25813 Combined forms of age-related cataract, bilateral: Secondary | ICD-10-CM | POA: Diagnosis not present

## 2023-01-17 DIAGNOSIS — H04123 Dry eye syndrome of bilateral lacrimal glands: Secondary | ICD-10-CM | POA: Diagnosis not present

## 2023-01-21 DIAGNOSIS — Z1231 Encounter for screening mammogram for malignant neoplasm of breast: Secondary | ICD-10-CM | POA: Diagnosis not present

## 2023-04-29 DIAGNOSIS — Z23 Encounter for immunization: Secondary | ICD-10-CM | POA: Diagnosis not present

## 2023-05-11 DIAGNOSIS — M25562 Pain in left knee: Secondary | ICD-10-CM | POA: Diagnosis not present

## 2023-05-17 DIAGNOSIS — M1712 Unilateral primary osteoarthritis, left knee: Secondary | ICD-10-CM | POA: Diagnosis not present

## 2023-05-17 DIAGNOSIS — M25562 Pain in left knee: Secondary | ICD-10-CM | POA: Diagnosis not present

## 2023-05-23 DIAGNOSIS — M8589 Other specified disorders of bone density and structure, multiple sites: Secondary | ICD-10-CM | POA: Diagnosis not present

## 2023-05-23 DIAGNOSIS — Z780 Asymptomatic menopausal state: Secondary | ICD-10-CM | POA: Diagnosis not present

## 2023-06-14 DIAGNOSIS — M25562 Pain in left knee: Secondary | ICD-10-CM | POA: Diagnosis not present

## 2023-07-27 DIAGNOSIS — E039 Hypothyroidism, unspecified: Secondary | ICD-10-CM | POA: Diagnosis not present

## 2023-12-17 DIAGNOSIS — J439 Emphysema, unspecified: Secondary | ICD-10-CM | POA: Diagnosis not present

## 2023-12-17 DIAGNOSIS — I10 Essential (primary) hypertension: Secondary | ICD-10-CM | POA: Diagnosis not present

## 2023-12-17 DIAGNOSIS — E039 Hypothyroidism, unspecified: Secondary | ICD-10-CM | POA: Diagnosis not present

## 2023-12-17 DIAGNOSIS — E785 Hyperlipidemia, unspecified: Secondary | ICD-10-CM | POA: Diagnosis not present

## 2023-12-30 DIAGNOSIS — E039 Hypothyroidism, unspecified: Secondary | ICD-10-CM | POA: Diagnosis not present

## 2023-12-30 DIAGNOSIS — Z79899 Other long term (current) drug therapy: Secondary | ICD-10-CM | POA: Diagnosis not present

## 2023-12-30 DIAGNOSIS — I700 Atherosclerosis of aorta: Secondary | ICD-10-CM | POA: Diagnosis not present

## 2023-12-30 DIAGNOSIS — M8588 Other specified disorders of bone density and structure, other site: Secondary | ICD-10-CM | POA: Diagnosis not present

## 2023-12-30 DIAGNOSIS — Z0000 Encounter for general adult medical examination without abnormal findings: Secondary | ICD-10-CM | POA: Diagnosis not present

## 2023-12-30 DIAGNOSIS — E785 Hyperlipidemia, unspecified: Secondary | ICD-10-CM | POA: Diagnosis not present

## 2024-01-24 DIAGNOSIS — R92323 Mammographic fibroglandular density, bilateral breasts: Secondary | ICD-10-CM | POA: Diagnosis not present

## 2024-01-24 DIAGNOSIS — Z1231 Encounter for screening mammogram for malignant neoplasm of breast: Secondary | ICD-10-CM | POA: Diagnosis not present

## 2024-01-25 DIAGNOSIS — H04123 Dry eye syndrome of bilateral lacrimal glands: Secondary | ICD-10-CM | POA: Diagnosis not present

## 2024-01-25 DIAGNOSIS — H25813 Combined forms of age-related cataract, bilateral: Secondary | ICD-10-CM | POA: Diagnosis not present

## 2024-05-03 ENCOUNTER — Ambulatory Visit
Admission: EM | Admit: 2024-05-03 | Discharge: 2024-05-03 | Disposition: A | Discharge status: Home / Self Care | Source: Ambulatory Visit | Attending: Family Medicine | Admitting: Family Medicine

## 2024-05-03 ENCOUNTER — Ambulatory Visit

## 2024-05-03 ENCOUNTER — Other Ambulatory Visit: Payer: Self-pay

## 2024-05-03 DIAGNOSIS — R10A2 Flank pain, left side: Secondary | ICD-10-CM

## 2024-05-03 DIAGNOSIS — J189 Pneumonia, unspecified organism: Secondary | ICD-10-CM

## 2024-05-03 DIAGNOSIS — R3129 Other microscopic hematuria: Secondary | ICD-10-CM | POA: Diagnosis not present

## 2024-05-03 DIAGNOSIS — R071 Chest pain on breathing: Secondary | ICD-10-CM | POA: Diagnosis not present

## 2024-05-03 LAB — POCT URINALYSIS DIP (MANUAL ENTRY)
Bilirubin, UA: NEGATIVE
Glucose, UA: NEGATIVE mg/dL
Ketones, POC UA: NEGATIVE mg/dL
Leukocytes, UA: NEGATIVE
Nitrite, UA: NEGATIVE
Protein Ur, POC: NEGATIVE mg/dL
Spec Grav, UA: 1.015 (ref 1.010–1.025)
Urobilinogen, UA: 0.2 U/dL
pH, UA: 6 (ref 5.0–8.0)

## 2024-05-03 MED ORDER — MELOXICAM 7.5 MG PO TABS: 7.5000 mg | ORAL_TABLET | Freq: Every day | ORAL | 0 refills | Status: DC

## 2024-05-03 MED ORDER — AZITHROMYCIN 250 MG PO TABS: 250.0000 mg | ORAL_TABLET | Freq: Every day | ORAL | 0 refills | Status: DC

## 2024-05-03 MED ORDER — AMOXICILLIN-POT CLAVULANATE 875-125 MG PO TABS: 1.0000 | ORAL_TABLET | Freq: Two times a day (BID) | ORAL | 0 refills | Status: DC

## 2024-05-03 NOTE — ED Provider Notes (Signed)
 [facility]    CSN: [id number] Arrival date & time: 05/03/24  1346      History   Chief Complaint Chief Complaint  Patient presents with   Flank Pain    HPI Tonya Adams is a 78 y.o. female.   Very pleasant 78 year old woman who is here today for her first visit.  Her old records are at [facility] physicians and are unavailable to me.  She states she is under treatment for hyperlipidemia hypertension and thyroid  replacement therapy.  She generally is in good health.  She has known osteoporosis known osteo arthritis.  She had pneumonia last year in her left lung.  She had antibiotic treatment and then a chest x-ray to ensure that it had resolved.  She is here today because she is having pain in her right lower chest, right flank region.  Worse with deep breath.  Slight shortness of breath.  Is concerned she has recurrence of pneumonia.  No problems with bowels or bladder.  No fever or chills.  Slight decreased appetite and energy.  No exposure to flu or COVID.  Symptoms have been worsening over several days    Past Medical History:  Diagnosis Date   Hyperlipidemia    Hypertension    Thyroid  disease     Patient Active Problem List   Diagnosis Date Noted   Left ankle sprain 08/24/2016   Nondisplaced fracture of lateral malleolus of right fibula, initial encounter for closed fracture 08/24/2016    Past Surgical History:  Procedure Laterality Date   LAPAROSCOPIC ABDOMINAL EXPLORATION     THYROIDECTOMY     TONSILLECTOMY AND ADENOIDECTOMY     TUBAL LIGATION      OB History   No obstetric history on file.      Home Medications    Prior to Admission medications   Medication Sig Start Date End Date Taking? Authorizing Provider  amoxicillin-clavulanate (AUGMENTIN) 875-125 MG tablet Take 1 tablet by mouth every 12 (twelve) hours. 05/03/24  Yes Maranda Jamee Jacob, MD  azithromycin (ZITHROMAX) 250 MG tablet Take 1 tablet (250 mg total) by mouth daily. Take first 2  tablets together, then 1 every day until finished. 05/03/24  Yes Maranda Jamee Jacob, MD  meloxicam (MOBIC) 7.5 MG tablet Take 1 tablet (7.5 mg total) by mouth daily. 05/03/24  Yes Maranda Jamee Jacob, MD  levothyroxine (SYNTHROID, LEVOTHROID) 112 MCG tablet Take 112 mcg by mouth daily before breakfast.    [provider]  lisinopril (PRINIVIL,ZESTRIL) 20 MG tablet Take 20 mg by mouth daily.    [provider]  losartan (COZAAR) 25 MG tablet Take 25 mg by mouth daily.    [provider]  Multiple Vitamin (MULTIVITAMIN) capsule Take 1 capsule by mouth daily.    [provider]  simvastatin (ZOCOR) 40 MG tablet Take 40 mg by mouth daily.    [provider]    Family History Family History  Problem Relation Age of Onset   Heart attack Mother    CAD Father    Breast cancer Maternal Aunt     Social History Social History   Tobacco Use   Smoking status: Former    Types: Cigarettes   Smokeless tobacco: Never  Substance Use Topics   Alcohol use: Yes   Drug use: No     Allergies   Sulfa antibiotics   Review of Systems Review of Systems See HPI  Physical Exam Triage Vital Signs ED Triage Vitals  Encounter Vitals Group  BP 05/03/24 1351 (!) 184/79     Girls Systolic BP Percentile --      Girls Diastolic BP Percentile --      Boys Systolic BP Percentile --      Boys Diastolic BP Percentile --      Pulse Rate 05/03/24 1351 93     Resp 05/03/24 1351 16     Temp 05/03/24 1351 98.5 F (36.9 C)     Temp src --      SpO2 05/03/24 1351 93 %     Weight --      Height --      Head Circumference --      Peak Flow --      Pain Score 05/03/24 1355 4     Pain Loc --      Pain Education --      Exclude from Growth Chart --    No data found.  Updated Vital Signs BP (!) 184/79   Pulse 93   Temp 98.5 F (36.9 C)   Resp 16   SpO2 93%   Physical Exam Constitutional:      General: She is not in acute distress.    Appearance: She  is well-developed and normal weight.     Comments: Bright, pleasant, does not appear ill.  Slightly dyspneic with conversation  HENT:     Head: Normocephalic and atraumatic.     Right Ear: Tympanic membrane normal.     Left Ear: Tympanic membrane normal.     Nose: Nose normal.     Mouth/Throat:     Mouth: Mucous membranes are moist.     Pharynx: No posterior oropharyngeal erythema.  Eyes:     Conjunctiva/sclera: Conjunctivae normal.     Pupils: Pupils are equal, round, and reactive to light.  Cardiovascular:     Rate and Rhythm: Normal rate and regular rhythm.     Heart sounds: Normal heart sounds.  Pulmonary:     Effort: Pulmonary effort is normal. No respiratory distress.     Comments: Slight diminished breath sounds left base.  No rales or rhonchi Chest:     Chest wall: No tenderness.  Abdominal:     General: Abdomen is flat. There is no distension.     Palpations: Abdomen is soft.     Tenderness: There is abdominal tenderness. There is left CVA tenderness.     Comments: Mild left CVA tenderness.  There is tenderness to deep palpation in the left abdomen  Musculoskeletal:        General: Normal range of motion.     Cervical back: Normal range of motion.  Lymphadenopathy:     Cervical: No cervical adenopathy.  Skin:    General: Skin is warm and dry.  Neurological:     General: No focal deficit present.     Mental Status: She is alert.     Gait: Gait normal.      UC Treatments / Results  Labs (all labs ordered are listed, but only abnormal results are displayed) Labs Reviewed  POCT URINALYSIS DIP (MANUAL ENTRY) - Abnormal; Notable for the following components:      Result Value   Blood, UA moderate (*)    All other components within normal limits    EKG   Radiology DG Abd 2 Views Result Date: 05/03/2024 CLINICAL DATA:  Abdominal pain. EXAM: ABDOMEN - 2 VIEW COMPARISON:  None Available. FINDINGS: Moderate stool throughout the colon. No bowel dilatation or  evidence of  obstruction. A 5 mm radiopaque focus in the left upper abdomen may represent calcification and less likely a renal calculus. Osteopenia with degenerative changes of the spine. No acute osseous pathology. IMPRESSION: Moderate colonic stool burden. No bowel obstruction. Electronically Signed   By: Vanetta Chou M.D.   On: 05/03/2024 15:27   DG Chest 2 View Result Date: 05/03/2024 EXAM: 2 VIEW(S) XRAY OF THE CHEST 05/03/2024 02:41:32 PM COMPARISON: 09/28/2022 CLINICAL HISTORY: left flank pain, pain with insporation. C/o left flank pain x few days. In 2024 had pna and felt similar. Feels some sob which has been going on for a few days as well. No cough. No difficulty urinating, trace blood in urine. No fever that she's noticed. FINDINGS: LUNGS AND PLEURA: Small left pleural effusion is noted with mild left basilar subsegmental atelectasis. No focal pulmonary opacity. No pulmonary edema. No pneumothorax. HEART AND MEDIASTINUM: Aortic atherosclerosis. No acute abnormality of the cardiac and mediastinal silhouettes. BONES AND SOFT TISSUES: Stable old mid thoracic compression fractures. No acute osseous abnormality. IMPRESSION: 1. Small left pleural effusion. 2. Mild left basilar subsegmental atelectasis. 3. Stable old mid thoracic compression fractures. 4. Aortic atherosclerosis. Electronically signed by: Lynwood Seip MD 05/03/2024 03:08 PM EDT RP Workstation: [id number]    Procedures Procedures (including critical care time)  Medications Ordered in UC Medications - No data to display  Initial Impression / Assessment and Plan / UC Course  I have reviewed the triage vital signs and the nursing notes.  Pertinent labs & imaging results that were available during my care of the patient were reviewed by me and considered in my medical decision making (see chart for details).     Discussed with patient that it appears she has a recurrence of pneumonia.  She has findings in her left lower lobe as  previously.  She states that she did have an x-ray at the end of her treatment to assure that these had resolved.  I will treat her accordingly for community-acquired pneumonia. Patient does have flank pain and hematuria.  There exists a possibility she could have a kidney stone.  I discussed with her that if she has any increasing flank pain, difficulty urinating, fever or chills, or vomiting she needs to go to an emergency room.  Ideally a CAT scan could be done, but this is not available at the urgent care center.  Mild abnormality was seen on x-ray Final Clinical Impressions(s) / UC Diagnoses   Final diagnoses:  Acute left flank pain  Pain aggravated by breathing  Other microscopic hematuria  Community acquired pneumonia of left lower lobe of lung     Discharge Instructions      For the year you need to be on 2 antibiotics.  First take Augmentin (amoxicillin/clavulanate) 2 times a day with food. The second antibiotic is azithromycin.  Also called a Z-Pak.  You will take 2 pills today, then 1 a day until gone Need to drink lots of fluids Make sure you get plenty of rest You need to follow-up with your primary care doctor in the next couple of weeks to make sure you are improved and to recheck your urine test If you have any worsening in your flank pain, fever or vomiting, you must go to the emergency room I have also prescribed Mobic (meloxicam).  This is a One-A-Day arthritis pain medicine to help with your flank pain Call for problems   ED Prescriptions     Medication Sig Dispense Auth. Provider   amoxicillin-clavulanate (  AUGMENTIN) 875-125 MG tablet Take 1 tablet by mouth every 12 (twelve) hours. 14 tablet Maranda Jamee Jacob, MD   azithromycin (ZITHROMAX) 250 MG tablet Take 1 tablet (250 mg total) by mouth daily. Take first 2 tablets together, then 1 every day until finished. 6 tablet Maranda Jamee Jacob, MD   meloxicam (MOBIC) 7.5 MG tablet Take 1 tablet (7.5 mg total) by mouth  daily. 15 tablet Maranda Jamee Jacob, MD      PDMP not reviewed this encounter.   Maranda Jamee Jacob, MD 05/03/24 [phone]

## 2024-05-03 NOTE — Discharge Instructions (Signed)
 For the year you need to be on 2 antibiotics.  First take Augmentin (amoxicillin/clavulanate) 2 times a day with food. The second antibiotic is azithromycin.  Also called a Z-Pak.  You will take 2 pills today, then 1 a day until gone Need to drink lots of fluids Make sure you get plenty of rest You need to follow-up with your primary care doctor in the next couple of weeks to make sure you are improved and to recheck your urine test If you have any worsening in your flank pain, fever or vomiting, you must go to the emergency room I have also prescribed Mobic (meloxicam).  This is a One-A-Day arthritis pain medicine to help with your flank pain Call for problems

## 2024-05-03 NOTE — ED Triage Notes (Signed)
 C/o left flank pain x few days. In 2024 had pna and felt similar. Feels some sob which has been going on for a few days as well. No cough. No difficulty urinating, no blood in urine. No fever that she's noticed. Took ibuprofen which helped some.

## 2024-05-20 ENCOUNTER — Emergency Department (HOSPITAL_BASED_OUTPATIENT_CLINIC_OR_DEPARTMENT_OTHER)

## 2024-05-20 ENCOUNTER — Other Ambulatory Visit: Payer: Self-pay

## 2024-05-20 ENCOUNTER — Ambulatory Visit

## 2024-05-20 ENCOUNTER — Emergency Department (HOSPITAL_BASED_OUTPATIENT_CLINIC_OR_DEPARTMENT_OTHER)
Admission: EM | Admit: 2024-05-20 | Discharge: 2024-05-20 | Disposition: A | Discharge status: Home / Self Care | Attending: Emergency Medicine | Admitting: Emergency Medicine

## 2024-05-20 ENCOUNTER — Encounter (HOSPITAL_BASED_OUTPATIENT_CLINIC_OR_DEPARTMENT_OTHER): Payer: Self-pay

## 2024-05-20 ENCOUNTER — Ambulatory Visit
Admission: EM | Admit: 2024-05-20 | Discharge: 2024-05-20 | Disposition: A | Discharge status: Other Acute Inpatient Hospital | Attending: Family Medicine | Admitting: Family Medicine

## 2024-05-20 DIAGNOSIS — I10 Essential (primary) hypertension: Secondary | ICD-10-CM | POA: Diagnosis not present

## 2024-05-20 DIAGNOSIS — R0602 Shortness of breath: Secondary | ICD-10-CM | POA: Diagnosis present

## 2024-05-20 DIAGNOSIS — K5900 Constipation, unspecified: Secondary | ICD-10-CM

## 2024-05-20 DIAGNOSIS — R9389 Abnormal findings on diagnostic imaging of other specified body structures: Secondary | ICD-10-CM | POA: Diagnosis not present

## 2024-05-20 DIAGNOSIS — J439 Emphysema, unspecified: Secondary | ICD-10-CM | POA: Insufficient documentation

## 2024-05-20 DIAGNOSIS — Z79899 Other long term (current) drug therapy: Secondary | ICD-10-CM | POA: Insufficient documentation

## 2024-05-20 DIAGNOSIS — J9811 Atelectasis: Secondary | ICD-10-CM | POA: Insufficient documentation

## 2024-05-20 DIAGNOSIS — M545 Low back pain, unspecified: Secondary | ICD-10-CM

## 2024-05-20 DIAGNOSIS — I700 Atherosclerosis of aorta: Secondary | ICD-10-CM | POA: Insufficient documentation

## 2024-05-20 DIAGNOSIS — J90 Pleural effusion, not elsewhere classified: Secondary | ICD-10-CM | POA: Insufficient documentation

## 2024-05-20 DIAGNOSIS — K449 Diaphragmatic hernia without obstruction or gangrene: Secondary | ICD-10-CM | POA: Insufficient documentation

## 2024-05-20 DIAGNOSIS — R0789 Other chest pain: Secondary | ICD-10-CM | POA: Diagnosis present

## 2024-05-20 HISTORY — DX: Pneumonia, unspecified organism: J18.9

## 2024-05-20 LAB — COMPREHENSIVE METABOLIC PANEL WITH GFR
ALT: 16 U/L (ref 0–44)
AST: 23 U/L (ref 15–41)
Albumin: 4.5 g/dL (ref 3.5–5.0)
Alkaline Phosphatase: 72 U/L (ref 38–126)
Anion gap: 13 (ref 5–15)
BUN: 18 mg/dL (ref 8–23)
CO2: 24 mmol/L (ref 22–32)
Calcium: 10.3 mg/dL (ref 8.9–10.3)
Chloride: 101 mmol/L (ref 98–111)
Creatinine, Ser: 0.81 mg/dL (ref 0.44–1.00)
GFR, Estimated: >60 mL/min (ref >60)
Glucose, Bld: 120 mg/dL — ABNORMAL HIGH (ref 70–99)
Potassium: 4.5 mmol/L (ref 3.5–5.1)
Sodium: 139 mmol/L (ref 135–145)
Total Bilirubin: 0.7 mg/dL (ref 0.0–1.2)
Total Protein: 7.7 g/dL (ref 6.5–8.1)

## 2024-05-20 LAB — CBC WITH DIFFERENTIAL/PLATELET
Abs Immature Granulocytes: 0.03 K/uL (ref 0.00–0.07)
Basophils Absolute: 0 K/uL (ref 0.0–0.1)
Basophils Relative: 0 %
Eosinophils Absolute: 0 K/uL (ref 0.0–0.5)
Eosinophils Relative: 0 %
HCT: 46.9 % — ABNORMAL HIGH (ref 36.0–46.0)
Hemoglobin: 15.4 g/dL — ABNORMAL HIGH (ref 12.0–15.0)
Immature Granulocytes: 0 %
Lymphocytes Relative: 11 %
Lymphs Abs: 1 K/uL (ref 0.7–4.0)
MCH: 29.9 pg (ref 26.0–34.0)
MCHC: 32.8 g/dL (ref 30.0–36.0)
MCV: 91.1 fL (ref 80.0–100.0)
Monocytes Absolute: 0.8 K/uL (ref 0.1–1.0)
Monocytes Relative: 9 %
Neutro Abs: 7.5 K/uL (ref 1.7–7.7)
Neutrophils Relative %: 80 %
Platelets: 308 K/uL (ref 150–400)
RBC: 5.15 MIL/uL — ABNORMAL HIGH (ref 3.87–5.11)
RDW: 12.6 % (ref 11.5–15.5)
WBC: 9.4 K/uL (ref 4.0–10.5)
nRBC: 0 % (ref 0.0–0.2)

## 2024-05-20 LAB — PRO BRAIN NATRIURETIC PEPTIDE: Pro Brain Natriuretic Peptide: 421 pg/mL — ABNORMAL HIGH (ref <300.0)

## 2024-05-20 LAB — LIPASE, BLOOD: Lipase: 26 U/L (ref 11–51)

## 2024-05-20 LAB — TROPONIN T, HIGH SENSITIVITY
Troponin T High Sensitivity: <15 ng/L (ref 0–19)
Troponin T High Sensitivity: <15 ng/L (ref 0–19)

## 2024-05-20 MED ORDER — DOXYCYCLINE HYCLATE 100 MG PO CAPS: 100.0000 mg | ORAL_CAPSULE | Freq: Two times a day (BID) | ORAL | 0 refills | Status: DC

## 2024-05-20 MED ORDER — OXYCODONE HCL 5 MG PO TABS: 5.0000 mg | ORAL_TABLET | ORAL | 0 refills | Status: DC | PRN

## 2024-05-20 MED ORDER — LIDOCAINE 5 % EX PTCH: 1.0000 | MEDICATED_PATCH | CUTANEOUS | 0 refills | Status: DC

## 2024-05-20 MED ORDER — AMOXICILLIN-POT CLAVULANATE 875-125 MG PO TABS: 1.0000 | ORAL_TABLET | Freq: Two times a day (BID) | ORAL | 0 refills | Status: DC

## 2024-05-20 MED ORDER — IOHEXOL 350 MG/ML SOLN
75.0000 mL | Freq: Once | INTRAVENOUS | Status: AC | PRN
  Administered 2024-05-20: 75 mL via INTRAVENOUS

## 2024-05-20 NOTE — ED Provider Notes (Signed)
 [facility]    CSN: [id number] Arrival date & time: 05/20/24  0903      History   Chief Complaint Chief Complaint  Patient presents with   Shortness of Breath   Back Pain    HPI [facility] is a 78 y.o. female.   HPI 78 year old female presents with continued chest and back pain.  Patient was evaluated here on 05/03/2024 for CAP and acute flank pain.  Please see epic for that encounter note.  Patient returns today short of breath.  PMH significant for obesity, HTN, and HLD.  Past Medical History:  Diagnosis Date   Hyperlipidemia    Hypertension    PNA (pneumonia)    Thyroid  disease     Patient Active Problem List   Diagnosis Date Noted   Left ankle sprain 08/24/2016   Nondisplaced fracture of lateral malleolus of right fibula, initial encounter for closed fracture 08/24/2016    Past Surgical History:  Procedure Laterality Date   LAPAROSCOPIC ABDOMINAL EXPLORATION     THYROIDECTOMY     TONSILLECTOMY AND ADENOIDECTOMY     TUBAL LIGATION      OB History   No obstetric history on file.      Home Medications    Prior to Admission medications   Medication Sig Start Date End Date Taking? Authorizing Provider  levothyroxine (SYNTHROID, LEVOTHROID) 112 MCG tablet Take 112 mcg by mouth daily before breakfast.    [provider]  lisinopril (PRINIVIL,ZESTRIL) 20 MG tablet Take 20 mg by mouth daily.    [provider]  losartan (COZAAR) 25 MG tablet Take 25 mg by mouth daily.    [provider]  Multiple Vitamin (MULTIVITAMIN) capsule Take 1 capsule by mouth daily.    [provider]  simvastatin (ZOCOR) 40 MG tablet Take 40 mg by mouth daily.    [provider]    Family History Family History  Problem Relation Age of Onset   Heart attack Mother    CAD Father    Breast cancer Maternal Aunt     Social History Social History   Tobacco Use   Smoking status: Former    Types: Cigarettes   Smokeless  tobacco: Never  Substance Use Topics   Alcohol use: Yes   Drug use: No     Allergies   Sulfa antibiotics   Review of Systems Review of Systems  Cardiovascular:  Positive for chest pain.  Musculoskeletal:  Positive for back pain.  All other systems reviewed and are negative.    Physical Exam Triage Vital Signs ED Triage Vitals  Encounter Vitals Group     BP      Girls Systolic BP Percentile      Girls Diastolic BP Percentile      Boys Systolic BP Percentile      Boys Diastolic BP Percentile      Pulse      Resp      Temp      Temp src      SpO2      Weight      Height      Head Circumference      Peak Flow      Pain Score      Pain Loc      Pain Education      Exclude from Growth Chart    No data found.  Updated Vital Signs BP (!) 148/80   Pulse (!) 103   Temp 98.4 F (  36.9 C)   Resp (!) 24   SpO2 92%    Physical Exam Vitals and nursing note reviewed.  Constitutional:      Appearance: Normal appearance. She is normal weight. She is ill-appearing.  HENT:     Head: Normocephalic and atraumatic.     Mouth/Throat:     Mouth: Mucous membranes are moist.     Pharynx: Oropharynx is clear.  Eyes:     Extraocular Movements: Extraocular movements intact.     Conjunctiva/sclera: Conjunctivae normal.     Pupils: Pupils are equal, round, and reactive to light.  Cardiovascular:     Rate and Rhythm: Normal rate and regular rhythm.     Pulses: Normal pulses.     Heart sounds: Normal heart sounds.  Pulmonary:     Effort: Pulmonary effort is normal.     Breath sounds: No wheezing, rhonchi or rales.     Comments: Diminished breath sounds noted throughout Musculoskeletal:        General: Normal range of motion.     Cervical back: Normal range of motion and neck supple.  Skin:    General: Skin is warm and dry.     Coloration: Skin is pale.  Neurological:     General: No focal deficit present.     Mental Status: She is alert and oriented to person, place,  and time. Mental status is at baseline.  Psychiatric:        Mood and Affect: Mood normal.        Behavior: Behavior normal.      UC Treatments / Results  Labs (all labs ordered are listed, but only abnormal results are displayed) Labs Reviewed - No data to display  EKG   Radiology DG Chest 2 View Result Date: 05/20/2024 EXAM: 2 VIEW(S) XRAY OF THE CHEST 05/20/2024 09:48:00 AM COMPARISON: 05/03/2024 CLINICAL HISTORY: Still sob, has back pain, was dx with pna 10/16 FINDINGS: LUNGS AND PLEURA: Increasing small left pleural effusion. Increasing left basilar opacity. HEART AND MEDIASTINUM: Aortic atherosclerosis. BONES AND SOFT TISSUES: Stable upper thoracic compression deformities. IMPRESSION: 1. Mildly increase in left basilar opacity and small left pleural effusion. Electronically signed by: Norleen Kil MD 05/20/2024 10:03 AM EST RP Workstation: [id number]    Procedures Procedures (including critical care time)  Medications Ordered in UC Medications - No data to display  Initial Impression / Assessment and Plan / UC Course  I have reviewed the triage vital signs and the nursing notes.  Pertinent labs & imaging results that were available during my care of the patient were reviewed by me and considered in my medical decision making (see chart for details).     MDM: 1.  Shortness of breath-chest x-ray revealed above, patient advised; 2.  Abnormal chest x-ray-appears to be worsening to prior chest x-ray with diagnosis of CAP on 05/03/2024. Advised patient go to [facility] ED now for further evaluation of shortness of breath.  Patient agreed and verbalized understanding of these instructions and this plan of care today.  3.  Constipation, unspecified type- advised patient to increase daily servings of fruits, vegetables and increase fiber intake to 60 g/day.  Encouraged increase in daily water intake to 64 ounces per day.  Discharged to ED, hemodynamically  stable. Final Clinical Impressions(s) / UC Diagnoses   Final diagnoses:  Constipation, unspecified constipation type  Shortness of breath  Abnormal chest x-ray     Discharge Instructions      Advised patient go to  [facility] ED now for further evaluation of shortness of breath.  Advised patient to increase daily servings of fruits, vegetables and increase fiber intake to 60 g/day.  Encouraged increase in daily water intake to 64 ounces per day.     ED Prescriptions   None    PDMP not reviewed this encounter.   Teddy Sharper, FNP 05/20/24 1032

## 2024-05-20 NOTE — ED Provider Notes (Signed)
 [facility] Provider Note   CSN: [id number] Arrival date & time: 05/20/24  1048     Patient presents with: Chest Pain   [facility] is a 78 y.o. female.   HPI      78 year old female with a history of hypertension, hyperlipidemia, thyroid  disease, diagnosis of pneumonia on October 16 for which she was prescribed Augmentin and azithromycin, who presents with concern for shortness of breath and pleuritic chest and back pain.   2024 had pneumonia, when bgan to have back pain felt similar to taht and went to urgent care 10/16 and then put on abx Had some blood in urine, microscopic was negative  Finished meloxicam on Thursday This started Saturday (had been tehre some but was not severe over last 2 weeks) Middle of back pain and chest pain having pain, difficulty sleeping. Had taken ibuprofen but didn't last night. Pain more aching but if taking a deep breath then is more sharp.   Shortness of breath has been there but worse now No fever No leg pain or swelling No hx of dvt/pe/not on estrogen, no cancer hx Has not really had cough with this nor with pneumonia last year  Past Medical History:  Diagnosis Date   Hyperlipidemia    Hypertension    PNA (pneumonia)    Thyroid  disease      Prior to Admission medications   Medication Sig Start Date End Date Taking? Authorizing Provider  amoxicillin-clavulanate (AUGMENTIN) 875-125 MG tablet Take 1 tablet by mouth every 12 (twelve) hours. 05/20/24  Yes Dreama Longs, MD  doxycycline (VIBRAMYCIN) 100 MG capsule Take 1 capsule (100 mg total) by mouth 2 (two) times daily. 05/20/24  Yes Dreama Longs, MD  lidocaine (LIDODERM) 5 % Place 1 patch onto the skin daily. Remove & Discard patch within 12 hours or as directed by MD 05/20/24  Yes Dreama Longs, MD  oxyCODONE (ROXICODONE) 5 MG immediate release tablet Take 1 tablet (5 mg total) by mouth every 4 (four) hours as needed for severe  pain (pain score 7-10). 05/20/24  Yes Dreama Longs, MD  levothyroxine (SYNTHROID, LEVOTHROID) 112 MCG tablet Take 112 mcg by mouth daily before breakfast.    [provider]  lisinopril (PRINIVIL,ZESTRIL) 20 MG tablet Take 20 mg by mouth daily.    [provider]  losartan (COZAAR) 25 MG tablet Take 25 mg by mouth daily.    [provider]  Multiple Vitamin (MULTIVITAMIN) capsule Take 1 capsule by mouth daily.    [provider]  simvastatin (ZOCOR) 40 MG tablet Take 40 mg by mouth daily.    [provider]    Allergies: Sulfa antibiotics    Review of Systems  Updated Vital Signs BP (!) 161/66   Pulse 99   Temp 98.2 F (36.8 C) (Oral)   Resp 17   SpO2 96%   Physical Exam Vitals and nursing note reviewed.  Constitutional:      General: She is not in acute distress.    Appearance: She is well-developed. She is not diaphoretic.  HENT:     Head: Normocephalic and atraumatic.  Eyes:     Conjunctiva/sclera: Conjunctivae normal.  Cardiovascular:     Rate and Rhythm: Normal rate and regular rhythm.     Heart sounds: Normal heart sounds. No murmur heard.    No friction rub. No gallop.  Pulmonary:     Effort: Pulmonary effort is normal. No respiratory distress.  Breath sounds: Normal breath sounds. No wheezing or rales.  Abdominal:     General: There is no distension.     Palpations: Abdomen is soft.     Tenderness: There is no abdominal tenderness. There is no guarding.  Musculoskeletal:        General: No tenderness.     Cervical back: Normal range of motion.  Skin:    General: Skin is warm and dry.     Findings: No erythema or rash.  Neurological:     Mental Status: She is alert and oriented to person, place, and time.     (all labs ordered are listed, but only abnormal results are displayed) Labs Reviewed  PRO BRAIN NATRIURETIC PEPTIDE - Abnormal; Notable for the following components:      Result Value   Pro Brain  Natriuretic Peptide 421.0 (*)    All other components within normal limits  CBC WITH DIFFERENTIAL/PLATELET - Abnormal; Notable for the following components:   RBC 5.15 (*)    Hemoglobin 15.4 (*)    HCT 46.9 (*)    All other components within normal limits  COMPREHENSIVE METABOLIC PANEL WITH GFR - Abnormal; Notable for the following components:   Glucose, Bld 120 (*)    All other components within normal limits  LIPASE, BLOOD  TROPONIN T, HIGH SENSITIVITY  TROPONIN T, HIGH SENSITIVITY    EKG: EKG Interpretation Date/Time:  Sunday May 20 2024 11:08:32 EST Ventricular Rate:  98 PR Interval:  171 QRS Duration:  79 QT Interval:  342 QTC Calculation: 437 R Axis:   139  Text Interpretation: Sinus rhythm Left posterior fascicular block Low voltage, precordial leads Probable anteroseptal infarct, old No previous ECGs available Confirmed by Dreama Longs ([id number]) on 05/20/2024 11:25:21 AM  Radiology: CT Angio Chest PE W and/or Wo Contrast Result Date: 05/20/2024 CLINICAL DATA:  Pulmonary embolism (PE) suspected, high prob, chest pain, short of breath EXAM: CT ANGIOGRAPHY CHEST WITH CONTRAST TECHNIQUE: Multidetector CT imaging of the chest was performed using the standard protocol during bolus administration of intravenous contrast. Multiplanar CT image reconstructions and MIPs were obtained to evaluate the vascular anatomy. RADIATION DOSE REDUCTION: This exam was performed according to the departmental dose-optimization program which includes automated exposure control, adjustment of the mA and/or kV according to patient size and/or use of iterative reconstruction technique. CONTRAST:  75mL OMNIPAQUE  IOHEXOL  350 MG/ML SOLN COMPARISON:  05/20/2024, 09/28/2022 FINDINGS: Cardiovascular: This is a technically adequate evaluation of the pulmonary vasculature. No filling defects or pulmonary emboli. The heart is unremarkable without pericardial effusion. No evidence of thoracic aortic aneurysm or  dissection. Atherosclerosis of the aorta. 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 Mediastinum/Nodes: No enlarged mediastinal, hilar, or axillary lymph nodes. Thyroid  gland, trachea, and esophagus demonstrate no significant findings. Small hiatal hernia. Lungs/Pleura: Stable left pleural effusion, volume estimated less than 500 cc. Compressive atelectasis within the left lower lobe. Chronic scarring or subsegmental atelectasis within the lingula. Upper lobe predominant emphysema. No airspace disease or pneumothorax. Central airways are patent. Upper Abdomen: No acute abnormality. Musculoskeletal: Chronic compression deformities at T4 and T6 are stable. No acute or destructive bony abnormalities. Reconstructed images demonstrate no additional findings. Review of the MIP images confirms the above findings. IMPRESSION: 1. No evidence of pulmonary embolus. 2. Stable small left pleural effusion and left basilar atelectasis/scarring. 3.  Aortic Atherosclerosis (ICD10-I70.0). 4. Pulmonary emphysema is present. Pulmonary emphysema is an independent risk factor for lung cancer.  Recommend patient be considered for lung cancer screening. [place]  Chief [employer] Officer. Screening for Lung Cancer: [place]  Licensed Conveyancer. JAMA. 2021;325(10):962-970. 5. Small hiatal hernia. Electronically Signed   By: Ozell Daring M.D.   On: 05/20/2024 13:24   DG Chest 2 View Result Date: 05/20/2024 EXAM: 2 VIEW(S) XRAY OF THE CHEST 05/20/2024 11:35:44 AM COMPARISON: 05/20/2024 CLINICAL HISTORY: chest pain and SHOB FINDINGS: LUNGS AND PLEURA: Moderate left pleural effusion. Left basilar atelectasis and airspace opacities. No pulmonary edema. No pneumothorax. HEART AND MEDIASTINUM: Aortic arch calcifications. No acute abnormality of the cardiac and mediastinal silhouettes. BONES AND SOFT TISSUES: Similar midthoracic compression fractures.  IMPRESSION: 1. Moderate left pleural effusion and left basilar atelectasis/airspace opacities. 2. Similar midthoracic compression fractures. Electronically signed by: Evalene Coho MD 05/20/2024 11:42 AM EST RP Workstation: [id number]   DG Chest 2 View Result Date: 05/20/2024 EXAM: 2 VIEW(S) XRAY OF THE CHEST 05/20/2024 09:48:00 AM COMPARISON: 05/03/2024 CLINICAL HISTORY: Still sob, has back pain, was dx with pna 10/16 FINDINGS: LUNGS AND PLEURA: Increasing small left pleural effusion. Increasing left basilar opacity. HEART AND MEDIASTINUM: Aortic atherosclerosis. BONES AND SOFT TISSUES: Stable upper thoracic compression deformities. IMPRESSION: 1. Mildly increase in left basilar opacity and small left pleural effusion. Electronically signed by: Norleen Kil MD 05/20/2024 10:03 AM EST RP Workstation: [id number]     Procedures   Medications Ordered in the ED  iohexol  (OMNIPAQUE ) 350 MG/ML injection 75 mL (75 mLs Intravenous Contrast Given 05/20/24 1244)                                      78 year old female with a history of hypertension, hyperlipidemia, thyroid  disease, diagnosis of pneumonia on October 16 for which she was prescribed Augmentin and azithromycin, who presents with concern for shortness of breath and pleuritic chest and back pain.   Differential diagnosis for chest pain includes pulmonary embolus, dissection, pneumothorax, pneumonia, ACS, myocarditis, pericarditis.    EKG was done and evaluate by me and showed no acute ST changes and no signs of pericarditis.   Chest x-ray was done and evaluated by me and radiology and showed left pleural effusion and atelectasis/airspace opacities.  Labs completed and personally evaluated and interpreted by me show no sign of pancreatitis, hepatitis, electrolyte abnormalities, no leukocytosis, pro BNP 421, troponin WNL x2. Doubt ACS, CHF.  Given tachycardia, symptoms of pain, CT PE study ordered and shows no evidence of PE, does show  stable left pleural effusion, atelectasis/scarring, pulmonary emphysema.    Suspect symptoms related to pleural effusion, possible reactive effusion from pneumonia.  At this time not clear continuing pneumonia to require IV abx, but given symptoms worsening and ?atelectasis vs possible infiltrate will give rx for abx.  Recommend follow up with pulmonology for effusion and emphysema. Given rx for pain medication after discussion of risks. Patient discharged in stable condition with understanding of reasons to return.     Final diagnoses:  Pleural effusion on left    ED Discharge Orders          Ordered    doxycycline (VIBRAMYCIN) 100 MG capsule  2 times daily        05/20/24 1359    amoxicillin-clavulanate (AUGMENTIN) 875-125 MG tablet  Every 12 hours        05/20/24 1359    Ambulatory referral to Pulmonology       Comments: Emphysema, pleural effusion  05/20/24 1359    oxyCODONE (ROXICODONE) 5 MG immediate release tablet  Every 4 hours PRN        05/20/24 1359    lidocaine (LIDODERM) 5 %  Every 24 hours        05/20/24 1359               Dreama Longs, MD 05/20/24 2038

## 2024-05-20 NOTE — Discharge Instructions (Addendum)
 Advised [facility] go to [facility] ED now for further evaluation of shortness of breath.  Advised patient to increase daily servings of fruits, vegetables and increase fiber intake to 60 g/day.  Encouraged increase in daily water intake to 64 ounces per day.

## 2024-05-20 NOTE — ED Notes (Signed)
 Pt alert and oriented X 4 at the time of discharge. RR even and unlabored. No acute distress noted. Pt verbalized understanding of discharge instructions as discussed. Pt ambulatory to lobby at time of discharge.

## 2024-05-20 NOTE — ED Triage Notes (Addendum)
 Seen 10/16 with left flank pain, did xray and was dx with pna. Was put on z pack and amoxicillin, meloxicam. Completed these. Seen at pcp on 10/24, was feeling better and had ua which was negative. Finished meloxicam and pain came back (finished Thursday). Yesterday morning started feeling uncomfortable. Took ibuprofen which did help. Took ibuprofen again this morning. No cough, no fever. Pain is not to left flank anymore, seems to be in back now. Hurts in breast to take a deep breath. Feels sob.

## 2024-05-20 NOTE — ED Triage Notes (Signed)
 Reports generalized chest pain, SHOB and mid back pain for 2 weeks. Sent by UC. Denies cough, fevers, N/V  No obvious signs of respiratory distress in triage

## 2024-05-29 ENCOUNTER — Ambulatory Visit

## 2024-05-29 VITALS — BP 174/80 | HR 83 | Temp 98.6°F | Ht 63.0 in | Wt 146.6 lb

## 2024-05-29 DIAGNOSIS — J90 Pleural effusion, not elsewhere classified: Secondary | ICD-10-CM

## 2024-05-29 NOTE — Progress Notes (Signed)
 Subjective:   PATIENT ID: Tonya Adams GENDER: female DOB: 12/25/1945, MRN: [id number]   HPI Discussed the use of AI scribe software for clinical note transcription with the patient, who gave verbal consent to proceed.  History of Present Illness Tonya Adams is an 78 year old female with recurrent pneumonia who presents with sharp back pain and shortness of breath. She is accompanied by Octaviano.  She has been experiencing sharp back pain and shortness of breath since early to mid-October. No fever, chills, cough, or phlegm production. These symptoms are reminiscent of a similar episode in February 2024, diagnosed as pneumonia, despite the absence of a cough at that time.  In response to her current symptoms, she visited urgent care and was prescribed a Z-Pak and amoxicillin for five and seven days, respectively. An x-ray suggested fluid in the lung, prompting further evaluation. Her primary care doctor considered a kidney issue due to blood in the urine, but further testing showed no blood microscopically.  After completing the antibiotics, she experienced temporary improvement. However, following a visit from her son, she noted a recurrence of back pain and increased discomfort, leading her to seek further evaluation at urgent care and subsequently the ER.  Currently, she reports persistent shortness of breath and mild back pain, which she describes as a constant issue. She is completing a course of doxycycline, with only doses remaining for today and tomorrow. Her symptoms improved with antibiotics but recurred after the first course was completed.  Her past medical history includes episodes of pneumonia during college and around age 80, with a significant episode in February 2024.  Her current medications include losartan for blood pressure, levothyroxine for thyroid , simvastatin for cholesterol, and a multivitamin.     Past Medical History:  Diagnosis Date   Hyperlipidemia     Hypertension    PNA (pneumonia)    Thyroid  disease      Family History  Problem Relation Age of Onset   Heart attack Mother    CAD Father    Breast cancer Maternal Aunt      Social History   Socioeconomic History   Marital status: Married    Spouse name: Not on file   Number of children: Not on file   Years of education: Not on file   Highest education level: Not on file  Occupational History   Not on file  Tobacco Use   Smoking status: Former    Types: Cigarettes    Passive exposure: Past   Smokeless tobacco: Never  Substance and Sexual Activity   Alcohol use: Yes   Drug use: No   Sexual activity: Not on file  Other Topics Concern   Not on file  Social History Narrative   Not on file   Social Drivers of Health   Financial Resource Strain: Not on file  Food Insecurity: Not on file  Transportation Needs: Not on file  Physical Activity: Not on file  Stress: Not on file  Social Connections: Not on file  Intimate Partner Violence: Not on file     Allergies  Allergen Reactions   Sulfa Antibiotics      Outpatient Medications Prior to Visit  Medication Sig Dispense Refill   doxycycline (VIBRAMYCIN) 100 MG capsule Take 1 capsule (100 mg total) by mouth 2 (two) times daily. 20 capsule 0   levothyroxine (SYNTHROID, LEVOTHROID) 112 MCG tablet Take 112 mcg by mouth daily before breakfast. (Patient taking differently: Take 125 mcg by mouth daily  before breakfast.)     losartan (COZAAR) 25 MG tablet Take 25 mg by mouth daily. (Patient taking differently: Take 100 mg by mouth daily.)     Multiple Vitamin (MULTIVITAMIN) capsule Take 1 capsule by mouth daily.     simvastatin (ZOCOR) 40 MG tablet Take 40 mg by mouth daily.     amoxicillin-clavulanate (AUGMENTIN) 875-125 MG tablet Take 1 tablet by mouth every 12 (twelve) hours. (Patient not taking: Reported on 05/29/2024) 14 tablet 0   lidocaine (LIDODERM) 5 % Place 1 patch onto the skin daily. Remove & Discard patch within  12 hours or as directed by MD 30 patch 0   lisinopril (PRINIVIL,ZESTRIL) 20 MG tablet Take 20 mg by mouth daily.     oxyCODONE (ROXICODONE) 5 MG immediate release tablet Take 1 tablet (5 mg total) by mouth every 4 (four) hours as needed for severe pain (pain score 7-10). 10 tablet 0   No facility-administered medications prior to visit.    ROS Reviewed all systems and reported negative except as above     Objective:   Vitals:   05/29/24 1421  BP: (!) 174/80  Pulse: 83  Temp: 98.6 F (37 C)  TempSrc: Oral  SpO2: 97%  Weight: 146 lb 9.6 oz (66.5 kg)  Height: 5' 3 (1.6 m)    Physical Exam Physical Exam GENERAL: Appropriate to age, no acute distress. HEAD EYES EARS NOSE THROAT: Moist mucous membranes, atraumatic, normocephalic. CHEST: Clear to auscultation bilaterally, no wheezing, no crackles, no rales. Ultrasound reveals a small pleural effusion. CARDIAC: Regular rate and rhythm, normal S1, normal S2, no murmurs, no rubs, no gallops. ABDOMEN: Soft, nontender. NEUROLOGICAL: Motor and sensation grossly intact, alert and oriented times X 3. EXTREMITIES: Warm, well perfused, no edema.     CBC    Component Value Date/Time   WBC 9.4 05/20/2024 1125   RBC 5.15 (H) 05/20/2024 1125   HGB 15.4 (H) 05/20/2024 1125   HCT 46.9 (H) 05/20/2024 1125   PLT 308 05/20/2024 1125   MCV 91.1 05/20/2024 1125   MCH 29.9 05/20/2024 1125   MCHC 32.8 05/20/2024 1125   RDW 12.6 05/20/2024 1125   LYMPHSABS 1.0 05/20/2024 1125   MONOABS 0.8 05/20/2024 1125   EOSABS 0.0 05/20/2024 1125   BASOSABS 0.0 05/20/2024 1125     Chest imaging:  PFT:     No data to display          Labs:  Left pleural effusion       Assessment & Plan:   Assessment and Plan Assessment & Plan Left-sided pleural effusion with persistent symptoms Chronic Left-sided pleural effusion with sharp back pain and shortness of breath. CT scans show small fluid pocket. Differential includes infection, clot,  trauma, or cancer. Symptoms suggest infection-related inflammation. Fluid size and risk of lung collapse contraindicate aspiration. - Ordered lung ultrasound to assess fluid pocket. - Monitor symptoms, repeat ultrasound in one month. - Continue doxycycline. - Advised Tylenol  or ibuprofen for pain. - Instructed to report worsening symptoms such as increased pain, shortness of breath, fevers, or chills.  Shortness of breath Persistent shortness of breath likely related to pleural effusion and inflammation. No acute causes on CT scan. - Monitor symptoms, reassess if worsens. - Encouraged normal activity and exercise to prevent deconditioning.        Zola Herter, MD [facility] Office: [phone]

## 2024-05-29 NOTE — Patient Instructions (Addendum)
  VISIT SUMMARY: During your visit today, we discussed your ongoing back pain and shortness of breath, which have been persistent since October. You have a history of recurrent pneumonia, and recent imaging showed a small fluid pocket in your lung. We reviewed your current medications and recent treatments, including antibiotics, and planned further evaluations and symptom management.  YOUR PLAN: -Left-SIDED PLEURAL EFFUSION WITH PERSISTENT SYMPTOMS: You have a small fluid pocket in your right lung, which is causing your back pain and shortness of breath. This could be due to an infection, clot, trauma, or cancer. We will monitor this with a lung ultrasound and repeat the test in one month. Continue taking doxycycline and use Tylenol  or ibuprofen for pain. Please report any worsening symptoms such as increased pain, shortness of breath, fevers, or chills.  -SHORTNESS OF BREATH: Your shortness of breath is likely related to the fluid in your lung and the inflammation it causes. We will keep an eye on your symptoms and reassess if they worsen. It's important to stay active and exercise to prevent your condition from getting worse.  INSTRUCTIONS: Please complete your current course of doxycycline and take Tylenol  or ibuprofen as needed for pain. Report any worsening symptoms such as increased pain, shortness of breath, fevers, or chills. We will repeat the lung ultrasound in one month to monitor the fluid pocket.                      Contains text generated by Abridge.                                 Contains text generated by Abridge.

## 2024-05-31 NOTE — Telephone Encounter (Signed)
 Dr. Zaida, Please see patient message regarding her after visit summary.   Thank you.

## 2024-06-26 ENCOUNTER — Ambulatory Visit

## 2024-06-26 ENCOUNTER — Other Ambulatory Visit (HOSPITAL_COMMUNITY): Admission: RE | Admit: 2024-06-26 | Discharge: 2024-06-26 | Disposition: A | Discharge status: Home / Self Care

## 2024-06-26 ENCOUNTER — Other Ambulatory Visit

## 2024-06-26 DIAGNOSIS — J90 Pleural effusion, not elsewhere classified: Secondary | ICD-10-CM

## 2024-06-26 LAB — BODY FLUID CELL COUNT WITH DIFFERENTIAL
Eos, Fluid: 2 %
Lymphs, Fluid: 6 %
Monocyte-Macrophage-Serous Fluid: 13 % — ABNORMAL LOW (ref 50–90)
Neutrophil Count, Fluid: 79 % — ABNORMAL HIGH (ref 0–25)
Total Nucleated Cell Count, Fluid: 3114 uL — ABNORMAL HIGH (ref 0–1000)

## 2024-06-26 LAB — PROTEIN, PLEURAL OR PERITONEAL FLUID: Total protein, fluid: 4.5 g/dL

## 2024-06-26 LAB — LACTATE DEHYDROGENASE, PLEURAL OR PERITONEAL FLUID: LD, Fluid: 145 U/L — ABNORMAL HIGH (ref 3–23)

## 2024-06-26 NOTE — Progress Notes (Addendum)
 Thoracentesis  Procedure Note  Tonya Adams  [id number]  01/03/1946  Date:06/26/24  Time:1:12 PM   Provider Performing:Tonya Adams   Procedure: Thoracentesis with imaging guidance (67444)  Indication(s) Pleural Effusion  Consent Risks of the procedure as well as the alternatives and risks of each were explained to the patient and/or caregiver.  Consent for the procedure was obtained and is signed in the bedside chart  Anesthesia Topical only with 1% lidocaine     Time Out Verified patient identification, verified procedure, site/side was marked, verified correct patient position, special equipment/implants available, medications/allergies/relevant history reviewed, required imaging and test results available.   Sterile Technique Maximal sterile technique including full sterile barrier drape, hand hygiene, sterile gown, sterile gloves, mask, hair covering, sterile ultrasound probe cover (if used).  Procedure Description Ultrasound was used to identify appropriate pleural anatomy for placement and overlying skin marked.  Area of drainage cleaned and draped in sterile fashion. Lidocaine  was used to anesthetize the skin and subcutaneous tissue.  500 cc's of dark yellow appearing fluid was drained from the left pleural space. Catheter then removed and bandaid applied to site.   Complications/Tolerance None; patient tolerated the procedure well. Chest X-ray is ordered to confirm no post-procedural complication.   EBL Minimal   Specimen(s) Pleural fluid

## 2024-06-26 NOTE — Progress Notes (Signed)
 Subjective:   PATIENT ID: Tonya Adams GENDER: female DOB: 1946/04/06, MRN: [id number]   HPI Discussed the use of AI scribe software for clinical note transcription with the patient, who gave verbal consent to proceed.  History of Present Illness Tonya Adams is an 78 year old female who presents with recurrent shortness of breath and side pain.  She has experienced a recurrence of shortness of breath and pleuritic pain on her side. Initially, these symptoms improved after completing a course of antibiotics, with noticeable relief by the weekend following her last visit. However, the symptoms have returned, causing significant discomfort.  She also experiences chills and a dry cough, which she describes as not deep but still uncomfortable. No productive cough. She believes that the poor weather conditions may be exacerbating her symptoms.  Additionally, she mentions a 'little lump' on her back that was previously evaluated by other healthcare providers and deemed insignificant.     Past Medical History:  Diagnosis Date   Hyperlipidemia    Hypertension    PNA (pneumonia)    Thyroid  disease      Family History  Problem Relation Age of Onset   Heart attack Mother    CAD Father    Breast cancer Maternal Aunt      Social History   Socioeconomic History   Marital status: Married    Spouse name: Not on file   Number of children: Not on file   Years of education: Not on file   Highest education level: Not on file  Occupational History   Not on file  Tobacco Use   Smoking status: Former    Types: Cigarettes    Passive exposure: Past   Smokeless tobacco: Never  Substance and Sexual Activity   Alcohol use: Yes   Drug use: No   Sexual activity: Not on file  Other Topics Concern   Not on file  Social History Narrative   Not on file   Social Drivers of Health   Financial Resource Strain: Not on file  Food Insecurity: Not on file  Transportation Needs: Not on  file  Physical Activity: Not on file  Stress: Not on file  Social Connections: Not on file  Intimate Partner Violence: Not on file     Allergies  Allergen Reactions   Sulfa Antibiotics      Outpatient Medications Prior to Visit  Medication Sig Dispense Refill   levothyroxine (SYNTHROID, LEVOTHROID) 112 MCG tablet Take 112 mcg by mouth daily before breakfast.     losartan (COZAAR) 25 MG tablet Take 25 mg by mouth daily.     Multiple Vitamin (MULTIVITAMIN) capsule Take 1 capsule by mouth daily.     simvastatin (ZOCOR) 40 MG tablet Take 40 mg by mouth daily.     doxycycline  (VIBRAMYCIN ) 100 MG capsule Take 1 capsule (100 mg total) by mouth 2 (two) times daily. 20 capsule 0   No facility-administered medications prior to visit.    ROS Reviewed all systems and reported negative except as above     Objective:   Vitals:   06/26/24 1111  BP: (!) 134/50  Pulse: 80  Temp: 98.1 F (36.7 C)  SpO2: 97%  Weight: 150 lb 4.8 oz (68.2 kg)  Height: 5' 3 (1.6 m)    Physical Exam Physical Exam GENERAL: Appropriate to age, no acute distress. HEAD EYES EARS NOSE THROAT: Moist mucous membranes, atraumatic, normocephalic. CHEST: Clear to auscultation bilaterally, no wheezing, no crackles, no rales. CARDIAC: Regular  rate and rhythm, normal S1, normal S2, no murmurs, no rubs, no gallops. ABDOMEN: Soft, nontender. NEUROLOGICAL: Motor and sensation grossly intact, alert and oriented times X 3. EXTREMITIES: Warm, well perfused, no edema.     CBC    Component Value Date/Time   WBC 9.4 05/20/2024 1125   RBC 5.15 (H) 05/20/2024 1125   HGB 15.4 (H) 05/20/2024 1125   HCT 46.9 (H) 05/20/2024 1125   PLT 308 05/20/2024 1125   MCV 91.1 05/20/2024 1125   MCH 29.9 05/20/2024 1125   MCHC 32.8 05/20/2024 1125   RDW 12.6 05/20/2024 1125   LYMPHSABS 1.0 05/20/2024 1125   MONOABS 0.8 05/20/2024 1125   EOSABS 0.0 05/20/2024 1125   BASOSABS 0.0 05/20/2024 1125     Chest imaging: I  reviewed her CT angiogram performed on 05/20/24 which at that time showed small left pleural effusion similar to prior imaging.  Bedside ultrasound today showing a larger pleural effusion.  Repeat chest x-ray today shows increase in size of left pleural effusion.    PFT:     No data to display               Assessment & Plan:   Assessment and Plan Assessment & Plan Left-sided pleural effusion of unclear etiology. Patient exhibiting signs of inflammatory pleural effusion with worsening left-sided chest pain and shortness of breath with a dry cough.  She is also experiencing chills and fatigue.  Findings concerning for parapneumonic versus malignancy related versus idiopathic effusion.  Thoracentesis discussed with patient.  Risks and benefits discussed including obtaining fluid for diagnostic purposes as well as therapeutic purposes.  Discussed potential for recurrence. Recurrent dyspnea, pain, chills, and dry cough suggest fluid accumulation. Previous ultrasound showed small fluid pocket. Technical issues with ultrasound require alternative imaging. - Ordered chest x-ray to evaluate for fluid. - Consider drainage if fluid pocket identified. - Pleural fluid analysis including cell count, cytology, culture, LDH, protein and glucose levels.  I will follow-up with the patient based on the results of the thoracentesis.    I spent 32 minutes caring for this patient today, including preparing to see the patient, obtaining a medical history , reviewing a separately obtained history, performing a medically appropriate examination and/or evaluation, counseling and educating the patient/family/caregiver, ordering medications, tests, or procedures, referring and communicating with other health care professionals (not separately reported), documenting clinical information in the electronic health record, independently interpreting results (not separately reported/billed) and communicating results  to the patient/family/caregiver, and care coordination (not separately reported/billed)  MDM   I reviewed the result(s) of her chest x-ray  I have ordered pleural fluid analysis        Tonya LOISE Herter, MD [facility] 06/26/2024 12:51 PM          Tonya Herter, MD [facility] Office: [phone]

## 2024-06-26 NOTE — Patient Instructions (Signed)
  VISIT SUMMARY: You came in today because you have been experiencing shortness of breath and pain on your side again. These symptoms had improved after you finished a course of antibiotics but have now returned, along with chills and a dry cough.    YOUR PLAN: -RECURRENT LEFT PLEURAL EFFUSION: This means there is a buildup of fluid in the space around your lungs, which can cause shortness of breath and pain. We performed a thoracentesis to assess why that fluid is there.   INSTRUCTIONS: Please go to the ER if you have any problems today or tomorrow. I will give you a call to discuss results when I have them.  Follow up with us  if your symptoms worsen or if you experience any new symptoms.                           Contains text generated by Abridge.

## 2024-06-27 ENCOUNTER — Telehealth: Payer: Self-pay

## 2024-06-27 DIAGNOSIS — J90 Pleural effusion, not elsewhere classified: Secondary | ICD-10-CM

## 2024-06-27 MED ORDER — AMOXICILLIN-POT CLAVULANATE 875-125 MG PO TABS: 1.0000 | ORAL_TABLET | Freq: Two times a day (BID) | ORAL | 0 refills | Status: DC

## 2024-06-27 NOTE — Telephone Encounter (Signed)
 Called patient back, exudative neutrophil predominant fluid resulted from thoracentesis. Cytology and micro still pending. Will cover with Augmentin  for 10 days and wait for more results. Patient is in agreement with the plan.   Zola Herter, MD [facility] Office: [phone]   See Amion for personal pager PCCM on call pager [phone] until 7pm. Please call Elink 7p-7a. [phone]

## 2024-06-28 LAB — ALBUMIN, FLUID (OTHER): Albumin, Body Fluid Other: 3.1 g/dL

## 2024-06-28 LAB — GLUCOSE, BODY FLUID OTHER: Glucose, Fluid: 93 mg/dL

## 2024-06-28 LAB — CYTOLOGY - NON PAP

## 2024-06-29 ENCOUNTER — Telehealth: Payer: Self-pay

## 2024-06-29 DIAGNOSIS — J90 Pleural effusion, not elsewhere classified: Secondary | ICD-10-CM

## 2024-06-29 LAB — ACID-FAST (MYCOBACTERIA) SMEAR AND CULTURE WITH REFLEX TO IDENTIFICATION: Acid Fast Smear: NEGATIVE

## 2024-06-29 LAB — CHOLESTEROL, BODY FLUID: Cholesterol, Fluid: 130 mg/dL

## 2024-06-29 LAB — SPECIMEN STATUS REPORT

## 2024-06-29 NOTE — Telephone Encounter (Signed)
 Called patient to check how she is doing post thoracentesis. Had some pain and shortness of breath yesterday but feels slightly better today.   Will get a follow up CXR in 1 week    Zola Herter, MD [facility] Office: [phone]   See Amion for personal pager PCCM on call pager [phone] until 7pm. Please call Elink 7p-7a. [phone]

## 2024-07-01 LAB — BODY FLUID CULTURE

## 2024-07-06 ENCOUNTER — Ambulatory Visit

## 2024-07-06 DIAGNOSIS — J90 Pleural effusion, not elsewhere classified: Secondary | ICD-10-CM | POA: Diagnosis not present

## 2024-08-06 ENCOUNTER — Other Ambulatory Visit: Payer: Self-pay

## 2024-08-06 DIAGNOSIS — J90 Pleural effusion, not elsewhere classified: Secondary | ICD-10-CM

## 2024-08-07 ENCOUNTER — Ambulatory Visit

## 2024-08-07 ENCOUNTER — Ambulatory Visit (INDEPENDENT_AMBULATORY_CARE_PROVIDER_SITE_OTHER)

## 2024-08-07 VITALS — BP 146/80 | HR 79 | Ht 63.0 in | Wt 146.0 lb

## 2024-08-07 DIAGNOSIS — Z87891 Personal history of nicotine dependence: Secondary | ICD-10-CM

## 2024-08-07 DIAGNOSIS — J90 Pleural effusion, not elsewhere classified: Secondary | ICD-10-CM

## 2024-08-07 NOTE — Patient Instructions (Signed)
" °  VISIT SUMMARY: During your visit today, we discussed the follow-up of your resolved pleural effusion. You have had no recurrence of symptoms and are currently asymptomatic with good respiratory function.  YOUR PLAN: -RECURRENT LEFT PLEURAL EFFUSION: A pleural effusion is the buildup of excess fluid between the layers of the pleura outside the lungs. Your previous pleural effusion has resolved, and there is no evidence of cancer or infection. The fluid was consistent with pleurisy, which is inflammation of the tissues that line the lungs and chest cavity. You are currently asymptomatic and have good respiratory function. We will schedule a follow-up ultrasound in six months to ensure everything remains stable. If you continue to feel well and have no symptoms, you can cancel the follow-up appointment.  INSTRUCTIONS: Please schedule a follow-up ultrasound in six months. If you remain asymptomatic and feel well, you may cancel the follow-up appointment.    Contains text generated by Abridge.   "

## 2024-08-07 NOTE — Progress Notes (Signed)
 "   Subjective:   PATIENT ID: Tonya Adams GENDER: female DOB: 05-16-1946, MRN: [id number]   HPI Discussed the use of AI scribe software for clinical note transcription with the patient, who gave verbal consent to proceed.  History of Present Illness Tonya Adams is an 79 year old female who presents for follow-up of a resolved pleural effusion.  She has a history of left-sided pleural effusion, which was previously evaluated and treated. The effusion was initially associated with inflammation consistent with pleurisy, but prior tests showed no evidence of cancer or infection.  She has no current pain or breathing difficulties and has had no recurrence of symptoms since the last evaluation. She recalls experiencing some pain after a previous procedure, which caused concern, but she has since recovered without further issues.     Past Medical History:  Diagnosis Date   Hyperlipidemia    Hypertension    PNA (pneumonia)    Thyroid  disease      Family History  Problem Relation Age of Onset   Heart attack Mother    CAD Father    Breast cancer Maternal Aunt      Social History   Socioeconomic History   Marital status: Married    Spouse name: Not on file   Number of children: Not on file   Years of education: Not on file   Highest education level: Not on file  Occupational History   Not on file  Tobacco Use   Smoking status: Former    Types: Cigarettes    Passive exposure: Past   Smokeless tobacco: Never  Substance and Sexual Activity   Alcohol use: Yes   Drug use: No   Sexual activity: Not on file  Other Topics Concern   Not on file  Social History Narrative   Not on file   Social Drivers of Health   Tobacco Use: Medium Risk (08/07/2024)   Patient History    Smoking Tobacco Use: Former    Smokeless Tobacco Use: Never    Passive Exposure: Past  Programmer, Applications: Not on [employer] Insecurity: Not on file  Transportation Needs: Not on file   Physical Activity: Not on file  Stress: Not on file  Social Connections: Not on file  Intimate Partner Violence: Not on file  Depression (PHQ2-9): Not on file  Alcohol Screen: Not on file  Housing: Not on file  Utilities: Not on file  Health Literacy: Not on file     Allergies[1]   Outpatient Medications Prior to Visit  Medication Sig Dispense Refill   levothyroxine (SYNTHROID, LEVOTHROID) 112 MCG tablet Take 112 mcg by mouth daily before breakfast.     losartan (COZAAR) 25 MG tablet Take 25 mg by mouth daily.     Multiple Vitamin (MULTIVITAMIN) capsule Take 1 capsule by mouth daily.     simvastatin (ZOCOR) 40 MG tablet Take 40 mg by mouth daily.     amoxicillin -clavulanate (AUGMENTIN ) 875-125 MG tablet Take 1 tablet by mouth 2 (two) times daily. 20 tablet 0   doxycycline  (VIBRAMYCIN ) 100 MG capsule Take 1 capsule (100 mg total) by mouth 2 (two) times daily. 20 capsule 0   No facility-administered medications prior to visit.    ROS Reviewed all systems and reported negative except as above     Objective:   Vitals:   08/07/24 1047  BP: (!) 146/80  Pulse: 79  SpO2: 100%  Weight: 146 lb (66.2 kg)  Height: 5' 3 (1.6 m)  Physical Exam Physical Exam GENERAL: Appropriate to age, no acute distress. HEAD EYES EARS NOSE THROAT: Moist mucous membranes, atraumatic, normocephalic. CHEST: Clear to auscultation bilaterally, no wheezing, no crackles, no rales, no pleural effusion on the left. CARDIAC: Regular rate and rhythm, normal S1, normal S2, no murmurs, no rubs, no gallops. ABDOMEN: Soft, nontender. NEUROLOGICAL: Motor and sensation grossly intact, alert and oriented times X 3. EXTREMITIES: Warm, well perfused, no edema.     CBC    Component Value Date/Time   WBC 9.4 05/20/2024 1125   RBC 5.15 (H) 05/20/2024 1125   HGB 15.4 (H) 05/20/2024 1125   HCT 46.9 (H) 05/20/2024 1125   PLT 308 05/20/2024 1125   MCV 91.1 05/20/2024 1125   MCH 29.9 05/20/2024 1125    MCHC 32.8 05/20/2024 1125   RDW 12.6 05/20/2024 1125   LYMPHSABS 1.0 05/20/2024 1125   MONOABS 0.8 05/20/2024 1125   EOSABS 0.0 05/20/2024 1125   BASOSABS 0.0 05/20/2024 1125     Results Radiology Chest X-ray (08/07/2024): Resolution of left pleural effusion (Independently interpreted)  Pathology Pleural fluid cytology: No malignant cells, no evidence of infection, findings consistent with inflammation  Point-of-care thoracic ultrasound No pleural effusion on left side. No abnormal fluid collections visualized. Normal thoracic ultrasound findings.       Assessment & Plan:   Assessment and Plan Assessment & Plan Recurrent left pleural effusion Pleural effusion resolved. No malignancy or infection. Fluid consistent with pleurisy. Asymptomatic with good respiratory function. - Follow-up ultrasound in six months. - Cancel follow-up if asymptomatic and well.      Tonya Herter, MD [facility] Office: [phone]       [1]  Allergies Allergen Reactions   Sulfa Antibiotics    "

## 2024-08-11 LAB — ACID FAST CULTURE WITH REFLEXED SENSITIVITIES (MYCOBACTERIA): Acid Fast Culture: NEGATIVE
